# Patient Record
Sex: Female | Born: 1965 | Race: White | Hispanic: No | Marital: Married | Smoking: Never smoker
Health system: Southern US, Community
[De-identification: ages and names within clinical notes are randomized; demographics above are authoritative.]

## PROBLEM LIST (undated history)

## (undated) DIAGNOSIS — IMO0001 Reserved for inherently not codable concepts without codable children: Secondary | ICD-10-CM

## (undated) DIAGNOSIS — E559 Vitamin D deficiency, unspecified: Secondary | ICD-10-CM

## (undated) DIAGNOSIS — F329 Major depressive disorder, single episode, unspecified: Secondary | ICD-10-CM

## (undated) DIAGNOSIS — R232 Flushing: Secondary | ICD-10-CM

## (undated) DIAGNOSIS — D126 Benign neoplasm of colon, unspecified: Secondary | ICD-10-CM

## (undated) DIAGNOSIS — N390 Urinary tract infection, site not specified: Secondary | ICD-10-CM

## (undated) DIAGNOSIS — N200 Calculus of kidney: Secondary | ICD-10-CM

## (undated) DIAGNOSIS — F419 Anxiety disorder, unspecified: Secondary | ICD-10-CM

## (undated) DIAGNOSIS — Z8619 Personal history of other infectious and parasitic diseases: Secondary | ICD-10-CM

## (undated) DIAGNOSIS — F32A Depression, unspecified: Secondary | ICD-10-CM

## (undated) DIAGNOSIS — E213 Hyperparathyroidism, unspecified: Secondary | ICD-10-CM

## (undated) HISTORY — DX: Depression, unspecified: F32.A

## (undated) HISTORY — DX: Anxiety disorder, unspecified: F41.9

## (undated) HISTORY — DX: Urinary tract infection, site not specified: N39.0

## (undated) HISTORY — DX: Flushing: R23.2

## (undated) HISTORY — DX: Benign neoplasm of colon, unspecified: D12.6

## (undated) HISTORY — DX: Personal history of other infectious and parasitic diseases: Z86.19

## (undated) HISTORY — DX: Major depressive disorder, single episode, unspecified: F32.9

## (undated) HISTORY — DX: Vitamin D deficiency, unspecified: E55.9

## (undated) HISTORY — DX: Hyperparathyroidism, unspecified: E21.3

---
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
## 1990-04-21 HISTORY — PX: TONSILLECTOMY: SUR1361

## 2011-04-22 DIAGNOSIS — D126 Benign neoplasm of colon, unspecified: Secondary | ICD-10-CM

## 2011-04-22 HISTORY — DX: Benign neoplasm of colon, unspecified: D12.6

## 2011-11-26 HISTORY — PX: POLYPECTOMY: SHX149

## 2014-03-12 ENCOUNTER — Emergency Department (HOSPITAL_COMMUNITY)
Admission: EM | Admit: 2014-03-12 | Discharge: 2014-03-12 | Disposition: A | Discharge status: Home / Self Care | Payer: Managed Care, Other (non HMO) | Attending: Emergency Medicine | Admitting: Emergency Medicine

## 2014-03-12 ENCOUNTER — Emergency Department (HOSPITAL_COMMUNITY): Payer: Managed Care, Other (non HMO)

## 2014-03-12 ENCOUNTER — Encounter (HOSPITAL_COMMUNITY): Payer: Self-pay | Admitting: Emergency Medicine

## 2014-03-12 DIAGNOSIS — Z79899 Other long term (current) drug therapy: Secondary | ICD-10-CM | POA: Diagnosis not present

## 2014-03-12 DIAGNOSIS — N200 Calculus of kidney: Secondary | ICD-10-CM

## 2014-03-12 DIAGNOSIS — N201 Calculus of ureter: Secondary | ICD-10-CM

## 2014-03-12 DIAGNOSIS — Z3202 Encounter for pregnancy test, result negative: Secondary | ICD-10-CM | POA: Diagnosis not present

## 2014-03-12 DIAGNOSIS — R109 Unspecified abdominal pain: Secondary | ICD-10-CM | POA: Diagnosis present

## 2014-03-12 DIAGNOSIS — N202 Calculus of kidney with calculus of ureter: Secondary | ICD-10-CM | POA: Insufficient documentation

## 2014-03-12 HISTORY — DX: Calculus of kidney: N20.0

## 2014-03-12 LAB — URINE MICROSCOPIC-ADD ON

## 2014-03-12 LAB — URINALYSIS, ROUTINE W REFLEX MICROSCOPIC
BILIRUBIN URINE: NEGATIVE
Glucose, UA: NEGATIVE mg/dL
KETONES UR: NEGATIVE mg/dL
Leukocytes, UA: NEGATIVE
NITRITE: NEGATIVE
PH: 6 (ref 5.0–8.0)
Protein, ur: NEGATIVE mg/dL
Specific Gravity, Urine: 1.012 (ref 1.005–1.030)
UROBILINOGEN UA: 0.2 mg/dL (ref 0.0–1.0)

## 2014-03-12 LAB — I-STAT CHEM 8, ED
BUN: 20 mg/dL (ref 6–23)
CREATININE: 0.9 mg/dL (ref 0.50–1.10)
Calcium, Ion: 1.28 mmol/L — ABNORMAL HIGH (ref 1.12–1.23)
Chloride: 105 mEq/L (ref 96–112)
GLUCOSE: 120 mg/dL — AB (ref 70–99)
HCT: 44 % (ref 36.0–46.0)
HEMOGLOBIN: 15 g/dL (ref 12.0–15.0)
POTASSIUM: 4.2 meq/L (ref 3.7–5.3)
Sodium: 138 mEq/L (ref 137–147)
TCO2: 23 mmol/L (ref 0–100)

## 2014-03-12 LAB — PREGNANCY, URINE: Preg Test, Ur: NEGATIVE

## 2014-03-12 MED ORDER — TAMSULOSIN HCL 0.4 MG PO CAPS: 0.4000 mg | ORAL_CAPSULE | Freq: Every day | ORAL | Status: DC

## 2014-03-12 MED ORDER — KETOROLAC TROMETHAMINE 60 MG/2ML IM SOLN
30.0000 mg | Freq: Once | INTRAMUSCULAR | Status: AC
  Administered 2014-03-12: 30 mg via INTRAMUSCULAR
  Filled 2014-03-12: qty 2

## 2014-03-12 MED ORDER — HYDROCODONE-ACETAMINOPHEN 5-325 MG PO TABS
1.0000 | ORAL_TABLET | Freq: Once | ORAL | Status: AC
  Administered 2014-03-12: 1 via ORAL
  Filled 2014-03-12: qty 1

## 2014-03-12 MED ORDER — ONDANSETRON 4 MG PO TBDP
4.0000 mg | ORAL_TABLET | Freq: Once | ORAL | Status: DC
  Filled 2014-03-12: qty 1

## 2014-03-12 MED ORDER — HYDROCODONE-ACETAMINOPHEN 5-325 MG PO TABS: 1.0000 | ORAL_TABLET | ORAL | Status: DC | PRN

## 2014-03-12 MED ORDER — NAPROXEN 500 MG PO TABS: 500.0000 mg | ORAL_TABLET | Freq: Two times a day (BID) | ORAL | Status: DC

## 2014-03-12 NOTE — ED Notes (Signed)
Pt c/o left flank pain and hurts to touch and n/v, pt does have hx of kidney stones and states this feels the same.

## 2014-03-12 NOTE — ED Provider Notes (Signed)
CSN: [id number]     Arrival date & time 03/12/14  7829 History   None    Chief Complaint  Patient presents with  . Back Pain  . Flank Pain    left   Patricia Ware is a 48 y.o. female with a history of 3 previous kidney stones who presents to the ED complaining of left flank pain intermittently since yesterday. She reports pain feels similar to her previous kidney stones. She reports the pain has been waxing and waning but is currently 7 out of 10 and dull.  She reports associated nausea and has vomited twice today with increased pain. She denies abdominal pain , fevers, dysuria, hematuria, diarrhea, chest pain, shortness of breath, or decreased urine output. She reports she has never had stents or lithotripsy for previous stones. She reports she has recently moved to the area from [place] and was seeing a urologist there. She reports she has an appointment with primary [facility] on 03/24/2014. She reports she is perimenopausal and her last cycle was February 2015.  (Consider location/radiation/quality/duration/timing/severity/associated sxs/prior Treatment) HPI  Past Medical History  Diagnosis Date  . Kidney stone    Past Surgical History  Procedure Laterality Date  . Cesarean section     No family history on file. History  Substance Use Topics  . Smoking status: Never Smoker   . Smokeless tobacco: Not on file  . Alcohol Use: Yes   OB History    No data available     Review of Systems  Constitutional: Negative for fever, chills and fatigue.  HENT: Negative for congestion, ear pain, sneezing, sore throat and trouble swallowing.   Eyes: Negative for pain and visual disturbance.  Respiratory: Negative for cough, shortness of breath and wheezing.   Cardiovascular: Negative for chest pain, palpitations and leg swelling.  Gastrointestinal: Positive for nausea and vomiting. Negative for abdominal pain, diarrhea and blood in stool.  Genitourinary: Positive for flank pain.  Negative for dysuria, urgency, hematuria, decreased urine volume, vaginal bleeding, difficulty urinating and menstrual problem.  Musculoskeletal: Negative for myalgias, back pain, neck pain and neck stiffness.  Skin: Negative for pallor, rash and wound.  Neurological: Negative for weakness, light-headedness and headaches.  All other systems reviewed and are negative.     Allergies  Review of patient's allergies indicates no known allergies.  Home Medications   Prior to Admission medications   Medication Sig Start Date End Date Taking? Authorizing Provider  escitalopram (LEXAPRO) 10 MG tablet Take 10 mg by mouth daily. 02/13/14  Yes Historical Provider, MD  saccharomyces boulardii (FLORASTOR) 250 MG capsule Take 250 mg by mouth daily.   Yes Historical Provider, MD  HYDROcodone-acetaminophen (NORCO/VICODIN) 5-325 MG per tablet Take 1 tablet by mouth every 4 (four) hours as needed for moderate pain or severe pain. 03/12/14   Verda Cumins Aarti Mankowski, PA-C  naproxen (NAPROSYN) 500 MG tablet Take 1 tablet (500 mg total) by mouth 2 (two) times daily with a meal. 03/12/14   Verda Cumins Jacinto Keil, PA-C  tamsulosin (FLOMAX) 0.4 MG CAPS capsule Take 1 capsule (0.4 mg total) by mouth daily. 03/12/14   Verda Cumins Ceana Fiala, PA-C   BP 106/71 mmHg  Pulse 74  Temp(Src) 98.4 F (36.9 C) (Oral)  Resp 16  SpO2 96% Physical Exam  Constitutional: She appears well-developed and well-nourished. No distress.  HENT:  Head: Normocephalic and atraumatic.  Mouth/Throat: Oropharynx is clear and moist. No oropharyngeal exudate.  Eyes: Conjunctivae are normal. Pupils are equal, round, and reactive  to light. Right eye exhibits no discharge. Left eye exhibits no discharge.  Neck: Neck supple.  Cardiovascular: Normal rate, regular rhythm, normal heart sounds and intact distal pulses.  Exam reveals no gallop and no friction rub.   No murmur heard. Pulmonary/Chest: Effort normal and breath sounds normal. No  respiratory distress. She has no wheezes. She has no rales.  Abdominal: Soft. Bowel sounds are normal. She exhibits no distension and no mass. There is no tenderness. There is no rebound and no guarding.  No CVA tenderness.  No rebound tenderness. Negative Rovsing sign. Negative psoas and obturator sign.  Musculoskeletal: She exhibits no edema.  Lymphadenopathy:    She has no cervical adenopathy.  Neurological: She is alert. Coordination normal.  Skin: Skin is warm and dry. No rash noted. She is not diaphoretic. No erythema. No pallor.  Psychiatric: She has a normal mood and affect. Her behavior is normal.  Nursing note and vitals reviewed.   ED Course  Procedures (including critical care time) Labs Review Labs Reviewed  URINALYSIS, ROUTINE W REFLEX MICROSCOPIC - Abnormal; Notable for the following:    Hgb urine dipstick SMALL (*)    All other components within normal limits  I-STAT CHEM 8, ED - Abnormal; Notable for the following:    Glucose, Bld 120 (*)    Calcium, Ion 1.28 (*)    All other components within normal limits  PREGNANCY, URINE  URINE MICROSCOPIC-ADD ON    Imaging Review Ct Renal Stone Study  03/12/2014   CLINICAL DATA:  Left flank pain.  Nausea, vomiting.  EXAM: CT ABDOMEN AND PELVIS WITHOUT CONTRAST  TECHNIQUE: Multidetector CT imaging of the abdomen and pelvis was performed following the standard protocol without IV contrast.  COMPARISON:  None.  FINDINGS: Lung bases are clear.  No effusions.  Heart is normal size.  Low-density area within the left hepatic lobe measures 18 mm on image 15, most likely a cyst although this cannot be characterized without intravenous contrast. Gallbladder, spleen, pancreas, adrenals and right kidney have an unremarkable unenhanced appearance.  There is mild to moderate left hydronephrosis due to 5 mm proximal left ureteral stone. Mild perinephric stranding. Multiple small non obstructing stones within the left kidney. Urinary bladder is  unremarkable.  Small cyst within the right ovary measuring 2.6 cm. The uterus and left adnexa unremarkable. Appendix is visualized and is normal. Stomach, large and small bowel grossly unremarkable. No acute bony abnormality.  IMPRESSION: 5 mm proximal left ureteral stone with mild to moderate left hydronephrosis and mild perinephric stranding. Left nephrolithiasis.   Electronically Signed   By: Rolm Baptise M.D.   On: 03/12/2014 10:29     EKG Interpretation None      Filed Vitals:   03/12/14 0920 03/12/14 1129  BP: 136/84 106/71  Pulse: 93 74  Temp: 98.2 F (36.8 C) 98.4 F (36.9 C)  TempSrc: Oral Oral  Resp: 18 16  SpO2: 99% 96%     MDM   Meds given in ED:  Medications  ondansetron (ZOFRAN-ODT) disintegrating tablet 4 mg (4 mg Oral Not Given 03/12/14 1001)  ketorolac (TORADOL) injection 30 mg (30 mg Intramuscular Given 03/12/14 1001)  HYDROcodone-acetaminophen (NORCO/VICODIN) 5-325 MG per tablet 1 tablet (1 tablet Oral Given 03/12/14 1121)    Discharge Medication List as of 03/12/2014 11:53 AM    START taking these medications   Details  HYDROcodone-acetaminophen (NORCO/VICODIN) 5-325 MG per tablet Take 1 tablet by mouth every 4 (four) hours as needed for moderate pain  or severe pain., Starting 03/12/2014, Until Discontinued, Print    naproxen (NAPROSYN) 500 MG tablet Take 1 tablet (500 mg total) by mouth 2 (two) times daily with a meal., Starting 03/12/2014, Until Discontinued, Print    tamsulosin (FLOMAX) 0.4 MG CAPS capsule Take 1 capsule (0.4 mg total) by mouth daily., Starting 03/12/2014, Until Discontinued, Print        Final diagnoses:  Left flank pain  Nephrolithiasis  Ureterolithiasis   Patricia Ware is a 48 y.o. female with a history of 3 previous kidney stones who presents to the ED complaining of left flank pain intermittently since yesterday. She reports her pain feels like her previous kidney stones. CT abdomen revealed a 5 mm proximal left ureteral  stone with mild to moderate left hydronephrosis. Cells left nephrolithiasis. Patient's urinalysis indicated small myohemoglobin, it is otherwise unremarkable. Patient reports her pain is much improved with Toradol and Norco as well as Zofran. Will discharge with Naprosyn, Flomax and Norco for pain control. Patient given referral for on-call urologist to follow-up with. Patient works she has an appointment with her PCP for 03/24/2014. Advised follow-up with her primary care this week. Patient was provided resource list for urologists and PCP. Patient education on kidney stones. Advised patient return to the ED with new or worsening symptoms or new concerns. Patient verbalized understanding and agreement pain.  Patient discussed with and evaluated by Dr. Kathrynn Humble who agrees assessment and plan.     Hanley Hays, PA-C 03/12/14 1237  Varney Biles, MD 03/12/14 [id number]

## 2014-03-12 NOTE — Discharge Instructions (Signed)
Kidney Stones °Kidney stones (urolithiasis) are deposits that form inside your kidneys. The intense pain is caused by the stone moving through the urinary tract. When the stone moves, the ureter goes into spasm around the stone. The stone is usually passed in the urine.  °CAUSES  °· A disorder that makes certain neck glands produce too much parathyroid hormone (primary hyperparathyroidism). °· A buildup of uric acid crystals, similar to gout in your joints. °· Narrowing (stricture) of the ureter. °· A kidney obstruction present at birth (congenital obstruction). °· Previous surgery on the kidney or ureters. °· Numerous kidney infections. °SYMPTOMS  °· Feeling sick to your stomach (nauseous). °· Throwing up (vomiting). °· Blood in the urine (hematuria). °· Pain that usually spreads (radiates) to the groin. °· Frequency or urgency of urination. °DIAGNOSIS  °· Taking a history and physical exam. °· Blood or urine tests. °· CT scan. °· Occasionally, an examination of the inside of the urinary bladder (cystoscopy) is performed. °TREATMENT  °· Observation. °· Increasing your fluid intake. °· Extracorporeal shock wave lithotripsy--This is a noninvasive procedure that uses shock waves to break up kidney stones. °· Surgery may be needed if you have severe pain or persistent obstruction. There are various surgical procedures. Most of the procedures are performed with the use of small instruments. Only small incisions are needed to accommodate these instruments, so recovery time is minimized. °The size, location, and chemical composition are all important variables that will determine the proper choice of action for you. Talk to your health care provider to better understand your situation so that you will minimize the risk of injury to yourself and your kidney.  °HOME CARE INSTRUCTIONS  °· Drink enough water and fluids to keep your urine clear or pale yellow. This will help you to pass the stone or stone fragments. °· Strain  all urine through the provided strainer. Keep all particulate matter and stones for your health care provider to see. The stone causing the pain may be as small as a grain of salt. It is very important to use the strainer each and every time you pass your urine. The collection of your stone will allow your health care provider to analyze it and verify that a stone has actually passed. The stone analysis will often identify what you can do to reduce the incidence of recurrences. °· Only take over-the-counter or prescription medicines for pain, discomfort, or fever as directed by your health care provider. °· Make a follow-up appointment with your health care provider as directed. °· Get follow-up X-rays if required. The absence of pain does not always mean that the stone has passed. It may have only stopped moving. If the urine remains completely obstructed, it can cause loss of kidney function or even complete destruction of the kidney. It is your responsibility to make sure X-rays and follow-ups are completed. Ultrasounds of the kidney can show blockages and the status of the kidney. Ultrasounds are not associated with any radiation and can be performed easily in a matter of minutes. °SEEK MEDICAL CARE IF: °· You experience pain that is progressive and unresponsive to any pain medicine you have been prescribed. °SEEK IMMEDIATE MEDICAL CARE IF:  °· Pain cannot be controlled with the prescribed medicine. °· You have a fever or shaking chills. °· The severity or intensity of pain increases over 18 hours and is not relieved by pain medicine. °· You develop a new onset of abdominal pain. °· You feel faint or pass out. °·   You are unable to urinate. MAKE SURE YOU:   Understand these instructions.  Will watch your condition.  Will get help right away if you are not doing well or get worse. Document Released: 04/07/2005 Document Revised: 12/08/2012 Document Reviewed: 09/08/2012 [facility] Patient Information 2015  [place], [place]. This information is not intended to replace advice given to you by your health care provider. Make sure you discuss any questions you have with your health care provider.  Emergency Department Resource Guide 1) Find a Doctor and Pay Out of Pocket Although you won't have to find out who is covered by your insurance plan, it is a good idea to ask around and get recommendations. You will then need to call the office and see if the doctor you have chosen will accept you as a new patient and what types of options they offer for patients who are self-pay. Some doctors offer discounts or will set up payment plans for their patients who do not have insurance, but you will need to ask so you aren't surprised when you get to your appointment.  2) Contact Your Local Health Department Not all health departments have doctors that can see patients for sick visits, but many do, so it is worth a call to see if yours does. If you don't know where your local health department is, you can check in your phone book. The CDC also has a tool to help you locate your state's health department, and many state websites also have listings of all of their local health departments.  3) Find a [facility] If your illness is not likely to be very severe or complicated, you may want to try a walk in clinic. These are popping up all over the country in pharmacies, drugstores, and shopping centers. They're usually staffed by nurse practitioners or physician assistants that have been trained to treat common illnesses and complaints. They're usually fairly quick and inexpensive. However, if you have serious medical issues or chronic medical problems, these are probably not your best option.  No Primary Care Doctor: - Call Health Connect at  [phone] - they can help you locate a primary care doctor that  accepts your insurance, provides certain services, etc. - Physician Referral Service- [phone]  Chronic Pain  Problems: Organization         Address  Phone   Notes  [facility]  [phone] Patients need to be referred by their primary care doctor.   Medication Assistance: Organization         Address  Phone   Notes  [facility] Medication [facility] [address] [place], [place] [address] [phone] --Must be a resident of [facility] -- Must have NO insurance coverage whatsoever (no Medicaid/ Medicare, etc.) -- The pt. MUST have a primary care doctor that directs their care regularly and follows them in the community   MedAssist  [phone]   [employer]  [phone]    Agencies that provide inexpensive medical care: Organization         Address  Phone   Notes  [place]  [phone]   [facility]    [phone]   [facility] [place], [place] [address] [phone]   [place] [address], [place] [phone]   Planned Parenthood    [phone]   [facility]    [phone])  [place]  [place] [address], [place] Phone:  [phone], Fax:  [phone] Hours of Operation:  9 am - 6 pm, M-F.  Also accepts Medicaid/Medicare and self-pay.  [facility] for [place] [address], [address], [place] Phone: [phone], Fax: [phone]. Hours of Operation:  8:30 am - 5:30 pm, M-F.  Also accepts Medicaid and self-pay.  [facility] [place] [address] [place] Phone: [phone]   [place], [place], [place] [phone], Ext. [phone] Mondays & Thursdays: 7-9 AM.  First 15 patients are seen on a first come, first serve basis.    [place] Providers:  Organization         Address  Phone   Notes  [facility] [address],  [phone] Also  accepts self-pay patients.  [facility] [address], [place]  [phone]   [place], [address], [place] [phone]   [facility] [address] [place] [phone]   Lucianne Lei [address], [place]   [phone] Only accepts [place] Access [place] patients after they have their name applied to their card.   Self-Pay (no insurance) in [facility]:  Organization         Address  Phone   Notes  Sickle Cell Patients, [facility] [place] [phone]   [facility] [place] [phone]   [facility] [place]  [place], [place], [place] [phone]   [facility]/Dr. Osei-Bonsu  [address], [place] or [address], [place] [phone] Phone number for both [place] and [place] locations is the same.  Urgent Medical and [facility] [address] [place] [phone]   [facility] [address] [place] or [address] [phone]   [facility] [address] [place] [phone], phone; [phone], fax Sees patients 1st and 3rd Saturday of every month.  Must not qualify for public or private insurance (i.e. Medicaid, Medicare, [place] Health Choice, Veterans' Benefits)  Household income should be no more than 200% of the poverty level The clinic cannot treat you if you are pregnant or think you are pregnant  Sexually transmitted diseases are not treated at the clinic.    Dental Care: Organization         Address  Phone  Notes  [facility] Department of [facility] [place] [phone] Accepts children up to age 37 who are enrolled in [place] or [place]; pregnant  women with a Medicaid card; and children who have applied for Medicaid or [place] Health Choice, but were declined, whose parents can pay a reduced fee at time of service.  [facility] Department of [facility]  [address], [place] [phone] Accepts children up to age 23 who are enrolled in [place] or [place]; pregnant women with a Medicaid card; and children who have applied for Medicaid or [place] Health Choice, but were declined, whose parents can pay a reduced fee at time of service.  [place] [facility] Access  PROGRAM  [place] [phone] Patients are seen by appointment only. Walk-ins are not accepted. [place] will see patients 44 years of age and older. Monday - Tuesday (8am-5pm) Most Wednesdays (8:30-5pm) $30 per visit, cash only  [facility] Adult Dental Access PROGRAM  [address], [facility] [phone] Patients are seen by appointment only. Walk-ins are not accepted. [place] will see patients 70 years of age and older. One Wednesday Evening (Monthly: Volunteer Based).  $30 per visit, cash only  [place]  [phone] for adults; Children under age 22, call Graduate Pediatric Dentistry at [phone]. Children aged 3-14, please call [phone] to request a pediatric application.  Dental services are provided in all areas of dental care including fillings, crowns and bridges, complete and partial dentures, implants, gum treatment, root canals, and extractions. Preventive care is also provided. Treatment is provided to both adults and children. Patients are selected via a lottery and there is often a waiting list.   [facility] [address] [place]  [phone] [email or web address]   [facility] [address] [place], [place] [phone], Ext. [phone] Second and Fourth Thursday of each month, opens at 6:30 AM; Clinic ends at 9 AM.  Patients are  seen on a first-come first-served basis, and a limited number are seen during each clinic.   [facility]  [address] Hillard Danker [place], [place] [phone]   Eligibility Requirements You must have lived in [place], [place], or [place] counties for at least the last three months.   You cannot be eligible for state or federal sponsored [employer], including [employer], [place], or [employer].   You generally cannot be eligible for healthcare insurance through your employer.    How to apply: Eligibility screenings are held every Tuesday and Wednesday afternoon from 1:00 pm until 4:00 pm. You do not need an appointment for the interview!  [facility] [address] [place], [place]   [place]  [place] Department  [place]  [phone]    Behavioral Health Resources in the Community: Intensive Outpatient Programs Organization         Address  Phone  Notes  [place]. [address], [place], [place] [phone]   [facility]patient [address] [place], [place]   ADS: Alcohol & Drug Svcs [address], [place], [place]   [place] [address]  [place], [place] or [phone]   Substance Abuse Resources Organization         Address  Phone  Notes  Alcohol and Drug Services  [phone]   [place]  [phone]   The [place]   Chinita Pester  [phone]   Residential & Outpatient Substance Abuse Program  [phone]   Psychological Services Organization         Address  Phone  Notes  [facility] [place]  [place]  [phone]   [place] [address], [place] or [phone]    Mobile Crisis  Teams Organization         Address  Phone  Notes  [facility], Mobile Crisis Care Unit  [phone]   [facility]  [address] [place], [place]  [place], [place] [phone]    Self-Help/Support Groups Organization         Address  Phone             Notes  [facility] of [place] - variety of support groups  [place] Call for more information  Narcotics Anonymous (NA), [facility] [address], [employer] [place]  2 meetings at this location   Special educational needs teacher         Address  Phone  Notes  [facility]idential [facility] [place],    [place]  [phone]   [facility]  [address], [place] [address], [place], [place]   [place], [place] [phone] Admissions: 8am-3pm M-F  Incentives Substance [place] [address],    [place], [place] [phone]   [facility] [address] [place], [place], [place]   [facility] [address]  [place], [place]   Insight Programs - Intensive Outpatient [address] Kristeen [address], [place], [place]   [facility] [place].,  [place], [place] [phone] or [phone]   [facility] Services (RTS) [address], [place], [place] Accepts Medicaid  [facility] [place] [address],  [place] [phone] Substance Abuse/Addiction Treatment   [facility] Organization         Address  Phone  Notes  [facility]  [phone]   Domenic Schwab, PhD [address] Arlis Port[place], [place]   [phone] or [phone]   [place], [place] [phone]   [facility] [address], [place], [place] [phone]  Insurance/Medicaid/sponsorship through [facility] and Families [address]                                    [place], [place] [phone] [place] [address], [place] [phone]    Dr. Adele Schilder  [phone]   [facility] [place] [facility] Dept. 1) [address] [employer] 2) [place] 3)  [place] [address], [place] [phone]  [phone]   [place] [phone] or [phone] (After Hours)

## 2014-03-24 ENCOUNTER — Encounter: Payer: Self-pay | Admitting: Family Medicine

## 2014-03-24 ENCOUNTER — Ambulatory Visit (INDEPENDENT_AMBULATORY_CARE_PROVIDER_SITE_OTHER): Payer: Managed Care, Other (non HMO) | Admitting: Family Medicine

## 2014-03-24 VITALS — BP 100/78 | HR 74 | Temp 98.2°F | Ht 67.75 in | Wt 173.8 lb

## 2014-03-24 DIAGNOSIS — H9319 Tinnitus, unspecified ear: Secondary | ICD-10-CM

## 2014-03-24 DIAGNOSIS — I781 Nevus, non-neoplastic: Secondary | ICD-10-CM

## 2014-03-24 DIAGNOSIS — F329 Major depressive disorder, single episode, unspecified: Secondary | ICD-10-CM

## 2014-03-24 DIAGNOSIS — Z7189 Other specified counseling: Secondary | ICD-10-CM

## 2014-03-24 DIAGNOSIS — Z7689 Persons encountering health services in other specified circumstances: Secondary | ICD-10-CM

## 2014-03-24 DIAGNOSIS — N200 Calculus of kidney: Secondary | ICD-10-CM

## 2014-03-24 DIAGNOSIS — F32A Depression, unspecified: Secondary | ICD-10-CM

## 2014-03-24 NOTE — [place] Notes (Addendum)
HPI:  Patricia Ware is here to establish care. Recently moved here from  Last PCP and physical: Sees Mehgan Morris for gyn visits  Has the following chronic problems that require follow up and concerns today:  Depression: -on lexapro for about 1 year -for irritability, emotional lability, anxiety, hot flashes -hx of depression before that on zoloft around age 48 -doing well -denies: SI, thoughts of self harm, hx hospitalization -may consider tapering off of this in the spring  Lots of moles: -followed by derm and wants to establish with dermatologist here, denies hx of skin cancer  Ringing in ears: - not pulsitile - has not had in a long time  Mild Hypercalcemia Chronically: -reports PCP simply followed in the past  Hx of Kidney stones: -long hx of kidney stones -went to ER 1.5 weeks ago -has appointment with urologist for this -using naproxen, flomax, hydrocodone - not using much, drinking plenty of water -is feeling better - but has not passed the stone to her knowledge -denies: fevers, chills, nausea, vomiting  ROS negative for unless reported above: fevers, unintentional weight loss, hearing or vision loss, chest pain, palpitations, struggling to breath, hemoptysis, melena, hematochezia, hematuria, falls, loc, si, thoughts of self harm  Past Medical History  Diagnosis Date  . Kidney stone   . Colon polyps   . History of UTI   . Anxiety   . Hot flashes   . History of chicken pox   . Depression     Past Surgical History  Procedure Laterality Date  . Cesarean section    . Umbilical hernia repair  1977  . Tonsillectomy  1992    Family History  Problem Relation Age of Onset  . Ulcerative colitis Father   . Deep vein thrombosis Father   . Pulmonary embolism Father   . Hypertension Father   . Hyperlipidemia Mother     and father  . Osteoarthritis Mother   . Gastric cancer Maternal Grandfather     History   Social History  . Marital Status: Married     Spouse Name: N/A    Number of Children: N/A  . Years of Education: N/A   Social History Main Topics  . Smoking status: Never Smoker   . Smokeless tobacco: None  . Alcohol Use: Yes     Comment: 1-2 drinks a few nights per week  . Drug Use: No  . Sexual Activity: None   Other Topics Concern  . None   Social History Narrative   Work or School: homemaker      Home Situation: lives with husband and 21 yo son in 2015      Spiritual Beliefs: Christian      Lifestyle: no regular exercise; diet is fair          Current outpatient prescriptions: Cholecalciferol (VITAMIN D3) 2000 UNITS TABS, Take by mouth daily., Disp: , Rfl: ;  escitalopram (LEXAPRO) 10 MG tablet, Take 10 mg by mouth daily., Disp: , Rfl: 10;  HYDROcodone-acetaminophen (NORCO/VICODIN) 5-325 MG per tablet, Take 1 tablet by mouth every 4 (four) hours as needed for moderate pain or severe pain., Disp: 10 tablet, Rfl: 0;  Lactobacillus (ACIDOPHILUS PO), Take by mouth daily., Disp: , Rfl:  naproxen (NAPROSYN) 500 MG tablet, Take 1 tablet (500 mg total) by mouth 2 (two) times daily with a meal., Disp: 30 tablet, Rfl: 0;  tamsulosin (FLOMAX) 0.4 MG CAPS capsule, Take 1 capsule (0.4 mg total) by mouth daily., Disp: 10 capsule, Rfl:  0  EXAM:  Filed Vitals:   03/24/14 1125  BP: 100/78  Pulse: 74  Temp: 98.2 F (36.8 C)    Body mass index is 26.62 kg/(m^2).  GENERAL: vitals reviewed and listed above, alert, oriented, appears well hydrated and in no acute distress  HEENT: atraumatic, conjunttiva clear, no obvious abnormalities on inspection of external nose and ears, normal inspection of both ear canals and TMs  NECK: no obvious masses on inspection  LUNGS: clear to auscultation bilaterally, no wheezes, rales or rhonchi, good air movement  CV: HRRR, no peripheral edema  SKIN: fair skin, multiple freckles and moles  MS: moves all extremities without noticeable abnormality  PSYCH: pleasant and cooperative, no  obvious depression or anxiety  ASSESSMENT AND PLAN:  Discussed the following assessment and plan:  Depression -continue lexapro, she wants to try taper of in a January - will follow up then for preventive visit and taper  Nephrolithiasis -seeing urologist  Tinnitus, unspecified laterality -resolved, follow up if recurs  Nevus, non-neoplastic -numbers provided for derm  Hypercalcemia -mild, reports chronic, - advised we recheck this and do further eval ipth, consider endo referral if abnormal -she opted to check in Jan at preventive visit with labs  Encounter to establish care -We reviewed the PMH, PSH, FH, SH, Meds and Allergies. -We provided refills for any medications we will prescribe as needed. -We addressed current concerns per orders and patient instructions. -We have asked for records for pertinent exams, studies, vaccines and notes from previous providers. -We have advised patient to follow up per instructions below.   -Patient advised to return or notify a doctor immediately if symptoms worsen or persist or new concerns arise.  Patient Instructions  Schedule preventive visit in January      KIM, [place]

## 2014-03-24 NOTE — [place] Notes (Signed)
Pre visit review using our clinic review tool, if applicable. No additional management support is needed unless otherwise documented below in the visit note. 

## 2014-03-24 NOTE — Patient Instructions (Signed)
Schedule preventive visit in January

## 2014-06-01 ENCOUNTER — Encounter: Payer: Self-pay | Admitting: Family Medicine

## 2014-06-01 ENCOUNTER — Ambulatory Visit (INDEPENDENT_AMBULATORY_CARE_PROVIDER_SITE_OTHER): Payer: BLUE CROSS/BLUE SHIELD | Admitting: Family Medicine

## 2014-06-01 VITALS — BP 104/80 | HR 70 | Temp 97.8°F | Ht 67.75 in | Wt 173.0 lb

## 2014-06-01 DIAGNOSIS — F32A Depression, unspecified: Secondary | ICD-10-CM

## 2014-06-01 DIAGNOSIS — R6889 Other general symptoms and signs: Secondary | ICD-10-CM

## 2014-06-01 DIAGNOSIS — Z0000 Encounter for general adult medical examination without abnormal findings: Secondary | ICD-10-CM

## 2014-06-01 DIAGNOSIS — F329 Major depressive disorder, single episode, unspecified: Secondary | ICD-10-CM

## 2014-06-01 DIAGNOSIS — Z86010 Personal history of colonic polyps: Secondary | ICD-10-CM

## 2014-06-01 LAB — LIPID PANEL
CHOL/HDL RATIO: 3
Cholesterol: 168 mg/dL (ref 0–200)
HDL: 62.6 mg/dL (ref >39.00)
LDL CALC: 89 mg/dL (ref 0–99)
NonHDL: 105.4
TRIGLYCERIDES: 81 mg/dL (ref 0.0–149.0)
VLDL: 16.2 mg/dL (ref 0.0–40.0)

## 2014-06-01 LAB — HEMOGLOBIN A1C: Hgb A1c MFr Bld: 5.6 % (ref 4.6–6.5)

## 2014-06-01 NOTE — [place] Notes (Signed)
Pre visit review using our clinic review tool, if applicable. No additional management support is needed unless otherwise documented below in the visit note. 

## 2014-06-01 NOTE — Patient Instructions (Signed)
BEFORE YOU LEAVE: -labs -follow up  We placed a referral for you as discussed for the colonoscopy. It usually takes about 1-2 weeks to process and schedule this referral. If you have not heard from [place] regarding this appointment in 2 weeks please contact our office.  -We have ordered labs or studies at this visit. It can take up to 1-2 weeks for results and processing. We will contact you with instructions IF your results are abnormal. Normal results will be released to your [facility]. If you have not heard from [place] or can not find your results in [facility] in 2 weeks please contact our office.  -PLEASE SIGN UP FOR MYCHART TODAY   We recommend the following healthy lifestyle measures: - eat a healthy diet consisting of lots of vegetables, fruits, beans, nuts, seeds, healthy meats such as white chicken and fish and whole grains.  - avoid fried foods, fast food, processed foods, sodas, red meet and other fattening foods.  - get a least 150 minutes of aerobic exercise per week.

## 2014-06-01 NOTE — Progress Notes (Signed)
HPI:  Here for CPE:  -Concerns and/or follow up today:   Depression: -on lexapro for about 1 year - now weaning and down to 5 mg and doing well and want tos stop next week -for irritability, emotional lability, anxiety, hot flashes in the past - no symptoms now -denies: SI, thoughts of self harm, hx hospitalization  Lots of moles: -followed by derm  - saw them a few weeks ago -no hx skin cancer  Mild Hypercalcemia Chronically: -reports PCP simply followed in the past and declined offered eval at initial visit -agrees to recheck today -does not take calcium or multivitamin D -she take vitamin D - every other day per her gyn doc -denies pain, weight loss, fevers, abd pain, or any other rsymptoms  Hx of Kidney stones: -long hx of kidney stones -[facility] -reports doing much better, working on diet, going to have urine analysis -denies: fevers, chills, nausea, vomiting  -Diet: variety of foods, balance and well rounded  -Exercise: no regular exercise - but starting this week every other day walking  -Taking folic acid, vitamin D or calcium: vit d  -Diabetes and Dyslipidemia Screening: doing today - FASTING  -Hx of HTN: no  -Vaccines: UTD  -pap history: had this with gyn  -FDLMP: almost 1 year  -sexual activity: yes, female partner, no new partners  -wants STI testing: no  -FH breast, colon or ovarian ca: see FH Last mammogram: don with gyn Last colon cancer screening: reports had colonoscopy 3 years ago for abd pain and had polyp and reports told needed repeat colonoscopy in 3 years  -Alcohol, Tobacco, drug use: see social history  Review of Systems - no fevers, unintentional weight loss, vision loss, hearing loss, chest pain, sob, hemoptysis, melena, hematochezia, hematuria, genital discharge, changing or concerning skin lesions, bleeding, bruising, loc, thoughts of self harm or SI  Past Medical History  Diagnosis Date  . Kidney stone   .  Colon polyps   . History of UTI   . Anxiety   . Hot flashes   . History of chicken pox   . Depression     Past Surgical History  Procedure Laterality Date  . Cesarean section    . Umbilical hernia repair  1977  . Tonsillectomy  1992    Family History  Problem Relation Age of Onset  . Ulcerative colitis Father   . Deep vein thrombosis Father   . Pulmonary embolism Father   . Hypertension Father   . Hyperlipidemia Mother     and father  . Osteoarthritis Mother   . Gastric cancer Maternal Grandfather     History   Social History  . Marital Status: Married    Spouse Name: N/A  . Number of Children: N/A  . Years of Education: N/A   Social History Main Topics  . Smoking status: Never Smoker   . Smokeless tobacco: Not on file  . Alcohol Use: Yes     Comment: 1-2 drinks a few nights per week  . Drug Use: No  . Sexual Activity: Not on file   Other Topics Concern  . None   Social History Narrative   Work or School: homemaker      Home Situation: lives with husband and 96 yo son in 2015      Spiritual Beliefs: Christian      Lifestyle: no regular exercise; diet is fair           Current outpatient prescriptions:  .  Cholecalciferol (VITAMIN D3) 2000 UNITS TABS, Take by mouth daily., Disp: , Rfl:  .  escitalopram (LEXAPRO) 10 MG tablet, Take 5 mg by mouth daily. , Disp: , Rfl: 10 .  Lactobacillus (ACIDOPHILUS PO), Take by mouth daily., Disp: , Rfl:   EXAM:  Filed Vitals:   06/01/14 0922  BP: 104/80  Pulse: 70  Temp: 97.8 F (36.6 C)    GENERAL: vitals reviewed and listed below, alert, oriented, appears well hydrated and in no acute distress  HEENT: head atraumatic, PERRLA, normal appearance of eyes, ears, nose and mouth. moist mucus membranes.  NECK: supple, no masses or lymphadenopathy  LUNGS: clear to auscultation bilaterally, no rales, rhonchi or wheeze  CV: HRRR, no peripheral edema or cyanosis, normal pedal pulses  BREAST:  declined  ABDOMEN: bowel sounds normal, soft, non tender to palpation, no masses, no rebound or guarding  GU: declined  RECTAL: refused  SKIN: no rash or abnormal lesions  MS: normal gait, moves all extremities normally  NEURO: CN II-XII grossly intact, normal muscle strength and sensation to light touch on extremities  PSYCH: normal affect, pleasant and cooperative  ASSESSMENT AND PLAN:  Discussed the following assessment and plan:  Visit for preventive health examination -Discussed and advised all [place] preventive services health task force level A and B recommendations for age, sex and risks.  -Advised at least 150 minutes of exercise per week and a healthy diet low in saturated fats and sweets and consisting of fresh fruits and vegetables, lean meats such as fish and white chicken and whole grains.  -FASTING labs, studies and vaccines per orders this encounter  Hypercalcemia -recheck ionized ca and ipth, discussed causes and likely referral to endocrine if PHPT, versus further workup  Depression -stop SSRI, follow up if needed  Hx of colonic polyp - Plan: Ambulatory referral to Gastroenterology   Orders Placed This Encounter  Procedures  . Lipid Panel  . Hemoglobin A1c  . PTH, intact and calcium  . Ambulatory referral to Gastroenterology    Referral Priority:  Routine    Referral Type:  Consultation    Referral Reason:  Specialty Services Required    Requested Specialty:  Gastroenterology    Number of Visits Requested:  1    Patient advised to return to clinic immediately if symptoms worsen or persist or new concerns.  Patient Instructions  BEFORE YOU LEAVE: -labs -follow up  We placed a referral for you as discussed for the colonoscopy. It usually takes about 1-2 weeks to process and schedule this referral. If you have not heard from [place] regarding this appointment in 2 weeks please contact our office.  -We have ordered labs or studies at this visit. It can  take up to 1-2 weeks for results and processing. We will contact you with instructions IF your results are abnormal. Normal results will be released to your [facility]. If you have not heard from [place] or can not find your results in [facility] in 2 weeks please contact our office.  -PLEASE SIGN UP FOR MYCHART TODAY   We recommend the following healthy lifestyle measures: - eat a healthy diet consisting of lots of vegetables, fruits, beans, nuts, seeds, healthy meats such as white chicken and fish and whole grains.  - avoid fried foods, fast food, processed foods, sodas, red meet and other fattening foods.  - get a least 150 minutes of aerobic exercise per week.        Return in about 1 year (around 06/02/2015), or  if symptoms worsen or fail to improve.  Colin Benton R.

## 2014-06-02 LAB — PTH, INTACT AND CALCIUM
CALCIUM: 10.5 mg/dL (ref 8.4–10.5)
PTH: 119 pg/mL — AB (ref 14–64)

## 2014-06-02 NOTE — Addendum Note (Signed)
Addended by: Agnes Lawrence on: 06/02/2014 04:48 PM   Modules accepted: Orders

## 2014-06-20 ENCOUNTER — Encounter: Payer: Self-pay | Admitting: Family Medicine

## 2014-07-05 ENCOUNTER — Encounter: Payer: Self-pay | Admitting: Internal Medicine

## 2014-07-18 ENCOUNTER — Encounter: Payer: Self-pay | Admitting: Internal Medicine

## 2014-07-25 ENCOUNTER — Encounter: Payer: Self-pay | Admitting: Family Medicine

## 2014-07-25 ENCOUNTER — Ambulatory Visit: Payer: BLUE CROSS/BLUE SHIELD | Admitting: Internal Medicine

## 2014-07-26 MED ORDER — LORAZEPAM 0.5 MG PO TABS: ORAL_TABLET | ORAL | Status: DC

## 2014-07-26 NOTE — Telephone Encounter (Signed)
Patient informed of the message below and she is aware the Rx was sent to her pharmacy.
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
## 2014-08-24 ENCOUNTER — Encounter: Payer: Self-pay | Admitting: Internal Medicine

## 2014-08-24 ENCOUNTER — Ambulatory Visit (INDEPENDENT_AMBULATORY_CARE_PROVIDER_SITE_OTHER): Payer: BLUE CROSS/BLUE SHIELD | Admitting: Internal Medicine

## 2014-08-24 VITALS — BP 112/68 | HR 92 | Temp 98.0°F | Resp 12 | Ht 68.0 in | Wt 173.0 lb

## 2014-08-24 DIAGNOSIS — E213 Hyperparathyroidism, unspecified: Secondary | ICD-10-CM

## 2014-08-24 DIAGNOSIS — E559 Vitamin D deficiency, unspecified: Secondary | ICD-10-CM

## 2014-08-24 LAB — VITAMIN D 25 HYDROXY (VIT D DEFICIENCY, FRACTURES): VITD: 22.14 ng/mL — AB (ref 30.00–100.00)

## 2014-08-24 MED ORDER — VITAMIN D 50 MCG (2000 UT) PO CAPS: ORAL_CAPSULE | ORAL | Status: DC

## 2014-08-24 NOTE — Patient Instructions (Signed)
Please stop at the lab.  Please start a urine collection ONLY if the vitamin D is normal. If not, we will need to start vitamin D supplements before collecting the urine.  Patient information (Up-to-Date): Collection of a 24-hour urine specimen  - You should collect every drop of urine during each 24-hour period. It does not matter how much or little urine is passed each time, as long as every drop is collected. - Begin the urine collection in the morning after you wake up, after you have emptied your bladder for the first time. - Urinate (empty the bladder) for the first time and flush it down the toilet. Note the exact time (eg, 6:15 AM). You will begin the urine collection at this time. - Collect every drop of urine during the day and night in an empty collection bottle. Store the bottle at room temperature or in the refrigerator. - If you need to have a bowel movement, any urine passed with the bowel movement should be collected. Try not to include feces with the urine collection. If feces does get mixed in, do not try to remove the feces from the urine collection bottle. - Finish by collecting the first urine passed the next morning, adding it to the collection bottle. This should be within ten minutes before or after the time of the first morning void on the first day (which was flushed). In this example, you would try to void between 6:05 and 6:25 on the second day. - If you need to urinate one hour before the final collection time, drink a full glass of water so that you can void again at the appropriate time. If you have to urinate 20 minutes before, try to hold the urine until the proper time. - Please note the exact time of the final collection, even if it is not the same time as when collection began on day 1. - The bottle(s) may be kept at room temperature for a day or two, but should be kept cool or refrigerated for longer periods of time.  Please come back for a follow-up appointment in  6 months

## 2014-08-24 NOTE — Progress Notes (Addendum)
Patient ID: Patricia Ware, female   DOB: [birth year], 49 y.o.   MRN: [id number]  HPI  Patricia Ware is a 49 y.o.-year-old female, referred by her PCP, Dr. Maudie Mercury, for evaluation for hypercalcemia/hyperparathyroidism. She moved to [place] 1 year ago.  Pt was dx with hypercalcemia in 2014. I reviewed pt's pertinent labs: Lab Results  Component Value Date   PTH 119* 06/01/2014   CALCIUM 10.5 06/01/2014  10/26/2012: Ca 10.2 (8.4-10.5) 09/21/2012: Ca 10.4 (8.7-10.2)  No previous DEXA scans. No fractures or falls.   + h/o kidney stones. First in late 20's, repeated episodes in the last 8 years. She has one now. She saw Urology.  No stone was captured. She had further investigation by urology, and we'll try to get these records.  No h/o CKD. Last BUN/Cr: Lab Results  Component Value Date   BUN 20 03/12/2014   CREATININE 0.90 03/12/2014   Pt is not on HCTZ.  No h/o vitamin D deficiency. No vit D levels available for review.   Pt is on vitamin D (not regularly); she also eats dairy and green, leafy, vegetables.   Pt c/o constipation but no AP.  Pt does not have a FH of hypercalcemia, pituitary tumors, thyroid cancer. + osteoporosis in mother. Father with h/o 1 kidney stone.  ROS: Constitutional: + weight gain, no fatigue, + hot flushes, + poor sleep, + excessive urination Eyes: no blurry vision, no xerophthalmia ENT: no sore throat, no nodules palpated in throat, no dysphagia/odynophagia, no hoarseness Cardiovascular: no CP/SOB/+ palpitations/no leg swelling Respiratory: no cough/SOB Gastrointestinal: no N/V/D/C, + heartburn Musculoskeletal: no muscle/joint aches Skin: no rashes, + hair loss Neurological: no tremors/numbness/tingling/dizziness Psychiatric: no depression/+ anxiety + low libido  Past Medical History  Diagnosis Date  . Kidney stone   . Colon polyps   . History of UTI   . Anxiety   . Hot flashes   . History of chicken pox   . Depression    Past Surgical History   Procedure Laterality Date  . Cesarean section    . Umbilical hernia repair  1977  . Tonsillectomy  1992   History   Social History  . Marital Status: Married    Spouse Name: N/A  . children 1   Social History Main Topics  . Smoking status: Never Smoker   . Smokeless tobacco: Not on file  . Alcohol Use: Yes     Comment: 6 wine drinks per week  . Drug Use: No   Social History Narrative   Work or School: homemaker      Home Situation: lives with husband and 33 yo son in 2015      Spiritual Beliefs: Christian      Lifestyle: no regular exercise; diet is fair         Current Outpatient Prescriptions on File Prior to Visit  Medication Sig Dispense Refill  . Cholecalciferol (VITAMIN D3) 2000 UNITS TABS Take by mouth daily.    . Lactobacillus (ACIDOPHILUS PO) Take by mouth daily.    Marland Kitchen LORazepam (ATIVAN) 0.5 MG tablet 0.5-1 tablet 30 min prior to flight (Patient not taking: Reported on 08/24/2014) 5 tablet 0   No current facility-administered medications on file prior to visit.   No Known Allergies Family History  Problem Relation Age of Onset  . Ulcerative colitis Father   . Deep vein thrombosis Father   . Pulmonary embolism Father   . Hypertension Father   . Hyperlipidemia Mother     and father  .  Osteoarthritis Mother   . Gastric cancer Maternal Grandfather     PE: BP 112/68 mmHg  Pulse 92  Temp(Src) 98 F (36.7 C) (Oral)  Resp 12  Ht 5\' 8"  (1.727 m)  Wt 173 lb (78.472 kg)  BMI 26.31 kg/m2  SpO2 98% Wt Readings from Last 3 Encounters:  08/24/14 173 lb (78.472 kg)  06/01/14 173 lb (78.472 kg)  03/24/14 173 lb 12.8 oz (78.835 kg)   Constitutional: overweight, in NAD. No kyphosis. Eyes: PERRLA, EOMI, no exophthalmos ENT: moist mucous membranes, no thyromegaly, no cervical lymphadenopathy Cardiovascular: RRR, No MRG Respiratory: CTA B Gastrointestinal: abdomen soft, NT, ND, BS+ Musculoskeletal: no deformities, strength intact in all 4 Skin: moist, warm,  no rashes Neurological: no tremor with outstretched hands, DTR normal in all 4  Assessment: 1. Hypercalcemia/hyperparathyroidism  Plan: Patient has had several instances of elevated calcium, with the highest level being at 10.4. An intact PTH level was also high, at 119 ([facility] assay).  It is unclear whether she has vitamin D deficiency.  She has apparent complications from hypercalcemia: + h/o nephrolithiasis. No osteoporosis, no fractures. No abdominal pain, depression, bone pain. - I discussed with the patient about the physiology of calcium and parathyroid hormone, and possible side effects from increased PTH, including kidney stones, osteoporosis, abdominal pain, etc.  - We discussed that we need to check whether his hyperparathyroidism is primary (Familial hypercalcemic hypocalciuria or parathyroid adenoma) or secondary (to conditions like: vitamin D deficiency, calcium malabsorption, hypercalciuria, renal insufficiency, etc.). - she meets criteria for parathyroid surgery:  Increased calcium by more than 1 mg/dL above the upper limit of normal  Kidney ds.  Osteoporosis (or Vb fx) Age <50 years old New (2013): High UCa >400 mg/d and increased stone risk by biochemical stone risk analysis Presence of nephrolithiasis or nephrocalcinosis Pt's preference!  - I will need to check: calcium level intact PTH ([facility]) Magnesium Phosphorus Vitamin D 1,25 HO   24h urinary calcium/creatinine ratio - if vit D normal - pt given instructions for urine collection and the jug - However, we will first check her vitamin D >> will proceed with the above investigation only if this is normal - If the tests indicate a parathyroid adenoma, I will refer her to surgery. We discussed possible consequences of hyperparathyroidism: ~1/3 pts will develop complications over 50 years (OP, nephrolithiasis). She agrees with the referral. - I will see the patient back in 6 months, I will discuss through my chart or  by phone  - time spent with the patient: 1 hour, of which >50% was spent in obtaining information about her condition, reviewing her previous labs, evaluations, and treatments, counseling her about her condition (please see the discussed topics above), and developing a plan to further investigate it; she had a number of questions which I addressed.  Office Visit on 08/24/2014  Component Date Value Ref Range Status  . VITD 08/24/2014 22.14* 30.00 - 100.00 ng/mL Final   Vit D low >> start 2000 units of vitamin D daily >> recheck level in 2 months. Hold off with the urine collection until the vitamin D is normal.  Addendum: Component     Latest Ref Rng 10/30/2014  VITD     30.00 - 100.00 ng/mL 27.98 (L)  Vitamin D improved to almost normal range. At this point, we can go ahead and check the above labs. Received records from patient's urologist, Dr. Louis Meckel from 10/10/2014: 24 hour urine calcium was 407 mg a day, which  is elevated. Her urine volume was 1.5 L. Calcium oxalate saturation, was also high, at 12.33, ideally <5. She also has an elevated saturation of calcium phosphate 2.27, ideally <1.2.  Component     Latest Ref Rng 11/01/2014  Sodium     135 - 145 mEq/L 140  Potassium     3.5 - 5.1 mEq/L 3.9  Chloride     96 - 112 mEq/L 106  CO2     19 - 32 mEq/L 29  Glucose     70 - 99 mg/dL 98  BUN     6 - 23 mg/dL 11  Creatinine     0.40 - 1.20 mg/dL 0.69  Calcium     8.4 - 10.5 mg/dL 10.2  GFR     >60.00 mL/min 95.69  Vitamin D 1, 25 (OH) Total     18 - 72 pg/mL 113 (H)  Vitamin D3 1, 25 (OH)      113  Vitamin D2 1, 25 (OH)      <8  PTH     14 - 64 pg/mL 132 (H)  Phosphorus     2.3 - 4.6 mg/dL 2.3  Magnesium     1.5 - 2.5 mg/dL 1.9   The elevated PTH ([facility]), elevated 1, 25 dihydroxy vitamin D, history of kidney stones, elevated urinary calcium, calcium at the upper limit of normal, all points towards primary hyperparathyroidism. I will refer her to  surgery.  CC: Dr. Louis Meckel - [facility]

## 2014-09-20 ENCOUNTER — Ambulatory Visit (AMBULATORY_SURGERY_CENTER): Payer: Self-pay | Admitting: *Deleted

## 2014-09-20 VITALS — Ht 68.0 in | Wt 175.2 lb

## 2014-09-20 DIAGNOSIS — Z86010 Personal history of colonic polyps: Secondary | ICD-10-CM

## 2014-09-20 DIAGNOSIS — Z8371 Family history of colonic polyps: Secondary | ICD-10-CM

## 2014-09-20 MED ORDER — MOVIPREP 100 G PO SOLR: 1.0000 | Freq: Once | ORAL | Status: DC

## 2014-09-20 NOTE — Progress Notes (Signed)
No egg or soy allergy No diet pills No home 02 use No issue with past sedation emmi declined

## 2014-09-27 ENCOUNTER — Encounter: Payer: Self-pay | Admitting: Internal Medicine

## 2014-10-04 ENCOUNTER — Ambulatory Visit (AMBULATORY_SURGERY_CENTER): Payer: BLUE CROSS/BLUE SHIELD | Admitting: Internal Medicine

## 2014-10-04 ENCOUNTER — Encounter: Payer: Self-pay | Admitting: Internal Medicine

## 2014-10-04 VITALS — BP 107/63 | HR 71 | Temp 98.8°F | Resp 23 | Ht 68.0 in | Wt 175.0 lb

## 2014-10-04 DIAGNOSIS — K635 Polyp of colon: Secondary | ICD-10-CM | POA: Diagnosis not present

## 2014-10-04 DIAGNOSIS — Z86010 Personal history of colonic polyps: Secondary | ICD-10-CM | POA: Diagnosis not present

## 2014-10-04 DIAGNOSIS — D125 Benign neoplasm of sigmoid colon: Secondary | ICD-10-CM

## 2014-10-04 HISTORY — PX: COLONOSCOPY: SHX174

## 2014-10-04 MED ORDER — SODIUM CHLORIDE 0.9 % IV SOLN: 500.0000 mL | INTRAVENOUS | Status: DC

## 2014-10-04 NOTE — Progress Notes (Signed)
Patient awakening,vss,report to rn 

## 2014-10-04 NOTE — Progress Notes (Signed)
No problems noted in the recovery room. maw 

## 2014-10-04 NOTE — Patient Instructions (Signed)
YOU HAD AN ENDOSCOPIC PROCEDURE TODAY AT THE  ENDOSCOPY CENTER:   Refer to the procedure report that was given to you for any specific questions about what was found during the examination.  If the procedure report does not answer your questions, please call your gastroenterologist to clarify.  If you requested that your care partner not be given the details of your procedure findings, then the procedure report has been included in a sealed envelope for you to review at your convenience later.  YOU SHOULD EXPECT: Some feelings of bloating in the abdomen. Passage of more gas than usual.  Walking can help get rid of the air that was put into your GI tract during the procedure and reduce the bloating. If you had a lower endoscopy (such as a colonoscopy or flexible sigmoidoscopy) you may notice spotting of blood in your stool or on the toilet paper. If you underwent a bowel prep for your procedure, you may not have a normal bowel movement for a few days.  Please Note:  You might notice some irritation and congestion in your nose or some drainage.  This is from the oxygen used during your procedure.  There is no need for concern and it should clear up in a day or so.  SYMPTOMS TO REPORT IMMEDIATELY:   Following lower endoscopy (colonoscopy or flexible sigmoidoscopy):  Excessive amounts of blood in the stool  Significant tenderness or worsening of abdominal pains  Swelling of the abdomen that is new, acute  Fever of 100F or higher   For urgent or emergent issues, a gastroenterologist can be reached at any hour by calling [phone].   DIET: Your first meal following the procedure should be a small meal and then it is ok to progress to your normal diet. Heavy or fried foods are harder to digest and may make you feel nauseous or bloated.  Likewise, meals heavy in dairy and vegetables can increase bloating.  Drink plenty of fluids but you should avoid alcoholic beverages for 24  hours.  ACTIVITY:  You should plan to take it easy for the rest of today and you should NOT DRIVE or use heavy machinery until tomorrow (because of the sedation medicines used during the test).    FOLLOW UP: Our staff will call the number listed on your records the next business day following your procedure to check on you and address any questions or concerns that you may have regarding the information given to you following your procedure. If we do not reach you, we will leave a message.  However, if you are feeling well and you are not experiencing any problems, there is no need to return our call.  We will assume that you have returned to your regular daily activities without incident.  If any biopsies were taken you will be contacted by phone or by letter within the next 1-3 weeks.  Please call us at [phone] if you have not heard about the biopsies in 3 weeks.    SIGNATURES/CONFIDENTIALITY: You and/or your care partner have signed paperwork which will be entered into your electronic medical record.  These signatures attest to the fact that that the information above on your After Visit Summary has been reviewed and is understood.  Full responsibility of the confidentiality of this discharge information lies with you and/or your care-partner.   Handouts were given to your care partner on polyps, hemorrhoids, and a high fiber diet with liberal fluid intake. You may resume your   current medications today. Await biopsy results. Please call if any questions or concerns.   

## 2014-10-04 NOTE — Op Note (Signed)
[place]  [address] [place], [address]   COLONOSCOPY PROCEDURE REPORT  PATIENT: Patricia Ware, Patricia Ware  MR#: [id number] BIRTHDATE: Jun 29, 1965 , 48  yrs. old GENDER: female ENDOSCOPIST: Lafayette Dragon, MD REFERRED [place]:[facility] Maudie Mercury, [place]:  10/04/2014 PROCEDURE:   Colonoscopy, surveillance and Colonoscopy with cold biopsy polypectomy First Screening Colonoscopy - Avg.  risk and is 50 yrs.  old or older - No.  Prior Negative Screening - Now for repeat screening. N/A  History of Adenoma - Now for follow-up colonoscopy & has been > or = to 3 yrs.  Yes hx of adenoma.  Has been 3 or more years since last colonoscopy.  Polyps removed today? Yes ASA CLASS:   Class II INDICATIONS:Surveillance due to prior colonic neoplasia and Colorectal Neoplasm Risk Assessment for this procedure is average risk. MEDICATIONS: Monitored anesthesia care and Propofol 450 mg IV  DESCRIPTION OF PROCEDURE:   After the risks benefits and alternatives of the procedure were thoroughly explained, informed consent was obtained.  The digital rectal exam revealed no abnormalities of the rectum.   The LB [id number]  endoscope was introduced through the anus and advanced to the cecum, which was identified by both the appendix and ileocecal valve. No adverse events experienced.   The quality of the prep was good.  (MoviPrep was used)  The instrument was then slowly withdrawn as the colon was fully examined. Estimated blood loss is zero unless otherwise noted in this procedure report.      COLON FINDINGS: A firm sessile polyp measuring 5 mm in size was found in the sigmoid colon.  A polypectomy was performed with cold forceps.  The resection was complete, the polyp tissue was completely retrieved and sent to histology.   Small internal Grade I hemorrhoids were found.  Retroflexed views revealed no abnormalities. The time to cecum = 19.20 Withdrawal time = 7.0 The scope was  withdrawn and the procedure completed. COMPLICATIONS: There were no immediate complications.  ENDOSCOPIC IMPRESSION: 1.   Sessile polyp was found in the sigmoid colon; polypectomy was performed with cold forceps 2.   Small internal Grade I hemorrhoids  RECOMMENDATIONS: 1.  Await pathology results 2.  High fiber diet Recall colonoscopy pending path report  eSigned:  Lafayette Dragon, MD 10/04/2014 10:48 AM   cc:   PATIENT NAME:  Patricia Ware MR#: [id number]

## 2014-10-04 NOTE — Progress Notes (Signed)
Called to room to assist during endoscopic procedure.  Patient ID and intended procedure confirmed with present staff. Received instructions for my participation in the procedure from the performing physician.  

## 2014-10-05 ENCOUNTER — Telehealth: Payer: Self-pay | Admitting: *Deleted

## 2014-10-05 NOTE — Telephone Encounter (Signed)
  Follow up Call-  Call back number 10/04/2014  Post procedure Call Back phone  # cell [phone]  Permission to leave phone message Yes     Patient questions:  Do you have a fever, pain , or abdominal swelling? No. Pain Score  0 *  Have you tolerated food without any problems? Yes.    Have you been able to return to your normal activities? Yes.    Do you have any questions about your discharge instructions: Diet   No. Medications  No. Follow up visit  No.  Do you have questions or concerns about your Care? No.  Actions: * If pain score is 4 or above: No action needed, pain <4.

## 2014-10-10 ENCOUNTER — Encounter: Payer: Self-pay | Admitting: Internal Medicine

## 2014-10-30 ENCOUNTER — Other Ambulatory Visit (INDEPENDENT_AMBULATORY_CARE_PROVIDER_SITE_OTHER): Payer: BLUE CROSS/BLUE SHIELD

## 2014-10-30 DIAGNOSIS — E559 Vitamin D deficiency, unspecified: Secondary | ICD-10-CM

## 2014-10-30 DIAGNOSIS — E213 Hyperparathyroidism, unspecified: Secondary | ICD-10-CM | POA: Diagnosis not present

## 2014-10-30 LAB — VITAMIN D 25 HYDROXY (VIT D DEFICIENCY, FRACTURES): VITD: 27.98 ng/mL — ABNORMAL LOW (ref 30.00–100.00)

## 2014-10-31 NOTE — Addendum Note (Signed)
Addended by: Philemon Kingdom on: 10/31/2014 07:58 AM   Modules accepted: Orders, Level of Service

## 2014-11-01 ENCOUNTER — Other Ambulatory Visit (INDEPENDENT_AMBULATORY_CARE_PROVIDER_SITE_OTHER): Payer: BLUE CROSS/BLUE SHIELD

## 2014-11-01 DIAGNOSIS — E213 Hyperparathyroidism, unspecified: Secondary | ICD-10-CM

## 2014-11-01 LAB — BASIC METABOLIC PANEL
BUN: 11 mg/dL (ref 6–23)
CHLORIDE: 106 meq/L (ref 96–112)
CO2: 29 mEq/L (ref 19–32)
CREATININE: 0.69 mg/dL (ref 0.40–1.20)
Calcium: 10.2 mg/dL (ref 8.4–10.5)
GFR: 95.69 mL/min (ref >60.00)
Glucose, Bld: 98 mg/dL (ref 70–99)
Potassium: 3.9 mEq/L (ref 3.5–5.1)
Sodium: 140 mEq/L (ref 135–145)

## 2014-11-01 LAB — PHOSPHORUS: Phosphorus: 2.3 mg/dL (ref 2.3–4.6)

## 2014-11-01 LAB — MAGNESIUM: Magnesium: 1.9 mg/dL (ref 1.5–2.5)

## 2014-11-02 LAB — PARATHYROID HORMONE, INTACT (NO CA): PTH: 132 pg/mL — ABNORMAL HIGH (ref 14–64)

## 2014-11-03 LAB — VITAMIN D 1,25 DIHYDROXY
VITAMIN D 1, 25 (OH) TOTAL: 113 pg/mL — AB (ref 18–72)
VITAMIN D3 1, 25 (OH): 113 pg/mL
Vitamin D2 1, 25 (OH)2: <8 pg/mL

## 2014-11-06 ENCOUNTER — Encounter: Payer: Self-pay | Admitting: Internal Medicine

## 2014-11-06 NOTE — Addendum Note (Signed)
Addended by: Philemon Kingdom on: 11/06/2014 07:56 AM   Modules accepted: Orders, Level of Service

## 2014-11-06 NOTE — Addendum Note (Signed)
Addended by: Philemon Kingdom on: 11/06/2014 04:15 PM   Modules accepted: Level of Service

## 2014-11-20 ENCOUNTER — Other Ambulatory Visit: Payer: Self-pay | Admitting: Surgery

## 2014-11-20 DIAGNOSIS — E213 Hyperparathyroidism, unspecified: Secondary | ICD-10-CM

## 2014-12-05 ENCOUNTER — Encounter (HOSPITAL_COMMUNITY)
Admission: RE | Admit: 2014-12-05 | Discharge: 2014-12-05 | Disposition: A | Discharge status: Home / Self Care | Payer: BLUE CROSS/BLUE SHIELD | Source: Ambulatory Visit | Attending: Surgery | Admitting: Surgery

## 2014-12-05 DIAGNOSIS — E213 Hyperparathyroidism, unspecified: Secondary | ICD-10-CM | POA: Insufficient documentation

## 2014-12-05 MED ORDER — TECHNETIUM TC 99M SESTAMIBI - CARDIOLITE
26.8000 | Freq: Once | INTRAVENOUS | Status: AC | PRN
  Administered 2014-12-05: 10:00:00 26.8 via INTRAVENOUS

## 2014-12-13 ENCOUNTER — Ambulatory Visit: Payer: Self-pay | Admitting: Surgery

## 2015-01-03 ENCOUNTER — Encounter (HOSPITAL_COMMUNITY)
Admission: RE | Admit: 2015-01-03 | Discharge: 2015-01-03 | Disposition: A | Discharge status: Home / Self Care | Payer: BLUE CROSS/BLUE SHIELD | Source: Ambulatory Visit | Attending: Surgery | Admitting: Surgery

## 2015-01-03 ENCOUNTER — Other Ambulatory Visit (HOSPITAL_COMMUNITY): Payer: Self-pay | Admitting: *Deleted

## 2015-01-03 ENCOUNTER — Encounter (HOSPITAL_COMMUNITY): Payer: Self-pay

## 2015-01-03 DIAGNOSIS — E210 Primary hyperparathyroidism: Secondary | ICD-10-CM | POA: Diagnosis not present

## 2015-01-03 DIAGNOSIS — Z01812 Encounter for preprocedural laboratory examination: Secondary | ICD-10-CM | POA: Insufficient documentation

## 2015-01-03 LAB — CBC
HCT: 41.1 % (ref 36.0–46.0)
HEMOGLOBIN: 13.6 g/dL (ref 12.0–15.0)
MCH: 29.8 pg (ref 26.0–34.0)
MCHC: 33.1 g/dL (ref 30.0–36.0)
MCV: 89.9 fL (ref 78.0–100.0)
PLATELETS: 191 10*3/uL (ref 150–400)
RBC: 4.57 MIL/uL (ref 3.87–5.11)
RDW: 12.4 % (ref 11.5–15.5)
WBC: 5 10*3/uL (ref 4.0–10.5)

## 2015-01-03 LAB — BASIC METABOLIC PANEL
ANION GAP: 6 (ref 5–15)
BUN: 10 mg/dL (ref 6–20)
CALCIUM: 10.5 mg/dL — AB (ref 8.9–10.3)
CO2: 29 mmol/L (ref 22–32)
CREATININE: 0.69 mg/dL (ref 0.44–1.00)
Chloride: 102 mmol/L (ref 101–111)
GFR calc non Af Amer: >60 mL/min (ref >60)
Glucose, Bld: 97 mg/dL (ref 65–99)
Potassium: 4.4 mmol/L (ref 3.5–5.1)
SODIUM: 137 mmol/L (ref 135–145)

## 2015-01-03 NOTE — Progress Notes (Signed)
Pt denies any cardiac history, chest pain or sob. Pt does have a family history of Pulmonary Embolus on father's side. Her father, her uncle and her aunt (Dad's brother and sister) have all had a PE. Her aunt died from a PE after surgery.

## 2015-01-03 NOTE — Pre-Procedure Instructions (Signed)
Patricia Ware  01/03/2015     Your procedure is scheduled on Monday, January 08, 2015 at 10:00 AM.   Report to [facility] Entrance "A" Admitting Office at 8:00 AM.   Call this number if you have problems the morning of surgery: [phone]   Any questions prior to day of surgery, please call [phone] between 8 & 4 PM.    Remember:  Do not eat food or drink liquids after midnight Sunday, 01/07/15.  Take these medicines the morning of surgery with A SIP OF WATER: None   Do not wear jewelry, make-up or nail polish.  Do not wear lotions, powders, or perfumes.  You may wear deodorant.  Do not shave 48 hours prior to surgery.    Do not bring valuables to the hospital.  [facility] is not responsible for any belongings or valuables.  Contacts, dentures or bridgework may not be worn into surgery.  Leave your suitcase in the car.  After surgery it may be brought to your room.  For patients admitted to the hospital, discharge time will be determined by your treatment team.  Patients discharged the day of surgery will not be allowed to drive home.   Special instructions:  [place] - Preparing for Surgery  Before surgery, you can play an important role.  Because skin is not sterile, your skin needs to be as free of germs as possible.  You can reduce the number of germs on you skin by washing with CHG (chlorahexidine gluconate) soap before surgery.  CHG is an antiseptic cleaner which kills germs and bonds with the skin to continue killing germs even after washing.  Please DO NOT use if you have an allergy to CHG or antibacterial soaps.  If your skin becomes reddened/irritated stop using the CHG and inform your nurse when you arrive at Short Stay.  Do not shave (including legs and underarms) for at least 48 hours prior to the first CHG shower.  You may shave your face.  Please follow these instructions carefully:   1.  Shower with CHG Soap the night before surgery and  the                                morning of Surgery.  2.  If you choose to wash your hair, wash your hair first as usual with your       normal shampoo.  3.  After you shampoo, rinse your hair and body thoroughly to remove the                      Shampoo.  4.  Use CHG as you would any other liquid soap.  You can apply chg directly       to the skin and wash gently with scrungie or a clean washcloth.  5.  Apply the CHG Soap to your body ONLY FROM THE NECK DOWN.        Do not use on open wounds or open sores.  Avoid contact with your eyes, ears, mouth and genitals (private parts).  Wash genitals (private parts) with your normal soap.  6.  Wash thoroughly, paying special attention to the area where your surgery        will be performed.  7.  Thoroughly rinse your body with warm water from the neck down.  8.  DO NOT shower/wash with your normal soap  after using and rinsing off       the CHG Soap.  9.  Pat yourself dry with a clean towel.            10.  Wear clean pajamas.            11.  Place clean sheets on your bed the night of your first shower and do not        sleep with pets.  Day of Surgery  Do not apply any lotions the morning of surgery.  Please wear clean clothes to the hospital.   Please read over the following fact sheets that you were given. Pain Booklet, Coughing and Deep Breathing and Surgical Site Infection Prevention

## 2015-01-06 ENCOUNTER — Telehealth: Payer: Self-pay | Admitting: General Surgery

## 2015-01-06 NOTE — Progress Notes (Signed)
Patient called reporting sore throat with temp of 100.3. Patient reports that her husband was sick last week and had to go on antibiotics. Reports that PCP does not have weekend office hours. Recommended her going to urgent care and that contact [facility] for on call physician. Patient verbalized understanding.

## 2015-01-06 NOTE — Telephone Encounter (Signed)
Patricia Ware has a sore throat, her husband just got over strep throat she thinks. She will go to urgent care today.   She is scheduled for neck surgery Monday morning -if she continues to worsen she will call tomorrow to cancel

## 2015-01-07 ENCOUNTER — Encounter: Payer: Self-pay | Admitting: General Surgery

## 2015-01-07 NOTE — Progress Notes (Unsigned)
She called today stating that over the past 30 hours she developed a severe sore throat along with low-grade fever and was feeling poorly. She had been seen at urgent care yesterday. She is scheduled for elective parathyroidectomy tomorrow. I told her given the acute onset of her febrile illness, I felt we should cancel the operation. I told her we would have the office call her back and reschedule her surgery when she was feeling better.

## 2015-01-08 ENCOUNTER — Ambulatory Visit (HOSPITAL_COMMUNITY): Admission: RE | Admit: 2015-01-08 | Payer: BLUE CROSS/BLUE SHIELD | Source: Ambulatory Visit | Admitting: Surgery

## 2015-01-08 ENCOUNTER — Encounter (HOSPITAL_COMMUNITY): Admission: RE | Payer: Self-pay | Source: Ambulatory Visit

## 2015-01-08 SURGERY — PARATHYROIDECTOMY
Anesthesia: General | Laterality: Right

## 2015-01-09 ENCOUNTER — Ambulatory Visit: Payer: Self-pay | Admitting: Surgery

## 2015-01-09 ENCOUNTER — Encounter (HOSPITAL_COMMUNITY): Payer: Self-pay | Admitting: *Deleted

## 2015-01-09 NOTE — Progress Notes (Signed)
Need obtain consent order for 01-19-15 surgery

## 2015-01-19 ENCOUNTER — Ambulatory Visit (HOSPITAL_COMMUNITY)
Admission: RE | Admit: 2015-01-19 | Discharge: 2015-01-19 | Disposition: A | Discharge status: Home / Self Care | Payer: BLUE CROSS/BLUE SHIELD | Source: Ambulatory Visit | Attending: Surgery | Admitting: Surgery

## 2015-01-19 ENCOUNTER — Encounter (HOSPITAL_COMMUNITY): Payer: Self-pay | Admitting: Surgery

## 2015-01-19 ENCOUNTER — Ambulatory Visit (HOSPITAL_COMMUNITY): Payer: BLUE CROSS/BLUE SHIELD | Admitting: Certified Registered Nurse Anesthetist

## 2015-01-19 ENCOUNTER — Encounter (HOSPITAL_COMMUNITY)
Admission: RE | Disposition: A | Discharge status: Home / Self Care | Payer: Self-pay | Source: Ambulatory Visit | Attending: Surgery

## 2015-01-19 DIAGNOSIS — Z79899 Other long term (current) drug therapy: Secondary | ICD-10-CM | POA: Insufficient documentation

## 2015-01-19 DIAGNOSIS — N200 Calculus of kidney: Secondary | ICD-10-CM | POA: Insufficient documentation

## 2015-01-19 DIAGNOSIS — F329 Major depressive disorder, single episode, unspecified: Secondary | ICD-10-CM | POA: Insufficient documentation

## 2015-01-19 DIAGNOSIS — F419 Anxiety disorder, unspecified: Secondary | ICD-10-CM | POA: Insufficient documentation

## 2015-01-19 DIAGNOSIS — D351 Benign neoplasm of parathyroid gland: Secondary | ICD-10-CM | POA: Insufficient documentation

## 2015-01-19 DIAGNOSIS — E210 Primary hyperparathyroidism: Secondary | ICD-10-CM | POA: Diagnosis present

## 2015-01-19 HISTORY — DX: Reserved for inherently not codable concepts without codable children: IMO0001

## 2015-01-19 HISTORY — PX: PARATHYROIDECTOMY: SHX19

## 2015-01-19 SURGERY — PARATHYROIDECTOMY
Anesthesia: General | Site: Neck | Laterality: Right

## 2015-01-19 MED ORDER — HYDROMORPHONE HCL 1 MG/ML IJ SOLN
INTRAMUSCULAR | Status: AC
  Administered 2015-01-19: 0.5 mg via INTRAVENOUS
  Filled 2015-01-19: qty 1

## 2015-01-19 MED ORDER — PROPOFOL 10 MG/ML IV BOLUS
INTRAVENOUS | Status: AC
  Filled 2015-01-19: qty 20

## 2015-01-19 MED ORDER — SUCCINYLCHOLINE CHLORIDE 20 MG/ML IJ SOLN
INTRAMUSCULAR | Status: DC | PRN
  Administered 2015-01-19: 50 mg via INTRAVENOUS

## 2015-01-19 MED ORDER — LIDOCAINE HCL (CARDIAC) 20 MG/ML IV SOLN
INTRAVENOUS | Status: DC | PRN
  Administered 2015-01-19: 50 mg via INTRAVENOUS

## 2015-01-19 MED ORDER — HYDROCODONE-ACETAMINOPHEN 5-325 MG PO TABS: 1.0000 | ORAL_TABLET | ORAL | Status: DC | PRN

## 2015-01-19 MED ORDER — MIDAZOLAM HCL 2 MG/2ML IJ SOLN
INTRAMUSCULAR | Status: AC
  Filled 2015-01-19: qty 4

## 2015-01-19 MED ORDER — 0.9 % SODIUM CHLORIDE (POUR BTL) OPTIME
TOPICAL | Status: DC | PRN
  Administered 2015-01-19: 1000 mL

## 2015-01-19 MED ORDER — ONDANSETRON HCL 4 MG/2ML IJ SOLN
INTRAMUSCULAR | Status: DC | PRN
  Administered 2015-01-19: 4 mg via INTRAVENOUS

## 2015-01-19 MED ORDER — FENTANYL CITRATE (PF) 250 MCG/5ML IJ SOLN
INTRAMUSCULAR | Status: AC
  Filled 2015-01-19: qty 25

## 2015-01-19 MED ORDER — ONDANSETRON HCL 4 MG/2ML IJ SOLN
INTRAMUSCULAR | Status: AC
  Filled 2015-01-19: qty 2

## 2015-01-19 MED ORDER — CEFAZOLIN SODIUM-DEXTROSE 2-3 GM-% IV SOLR
INTRAVENOUS | Status: AC
  Filled 2015-01-19: qty 50

## 2015-01-19 MED ORDER — PROMETHAZINE HCL 25 MG/ML IJ SOLN
6.2500 mg | INTRAMUSCULAR | Status: DC | PRN
  Administered 2015-01-19: 6.25 mg via INTRAVENOUS
  Filled 2015-01-19: qty 1

## 2015-01-19 MED ORDER — HYDROMORPHONE HCL 1 MG/ML IJ SOLN
0.2500 mg | INTRAMUSCULAR | Status: DC | PRN
  Administered 2015-01-19: 0.5 mg via INTRAVENOUS
  Administered 2015-01-19 (×2): 0.25 mg via INTRAVENOUS
  Administered 2015-01-19 (×2): 0.5 mg via INTRAVENOUS

## 2015-01-19 MED ORDER — BUPIVACAINE HCL 0.25 % IJ SOLN
INTRAMUSCULAR | Status: DC | PRN
  Administered 2015-01-19: 8.5 mL

## 2015-01-19 MED ORDER — GLYCOPYRROLATE 0.2 MG/ML IJ SOLN
INTRAMUSCULAR | Status: AC
  Filled 2015-01-19: qty 3

## 2015-01-19 MED ORDER — CEFAZOLIN SODIUM-DEXTROSE 2-3 GM-% IV SOLR
2.0000 g | INTRAVENOUS | Status: AC
  Administered 2015-01-19: 2 g via INTRAVENOUS

## 2015-01-19 MED ORDER — NEOSTIGMINE METHYLSULFATE 10 MG/10ML IV SOLN
INTRAVENOUS | Status: AC
  Filled 2015-01-19: qty 1

## 2015-01-19 MED ORDER — LACTATED RINGERS IV SOLN
INTRAVENOUS | Status: DC
  Administered 2015-01-19: 14:00:00 via INTRAVENOUS
  Administered 2015-01-19: 1000 mL via INTRAVENOUS

## 2015-01-19 MED ORDER — GLYCOPYRROLATE 0.2 MG/ML IJ SOLN
INTRAMUSCULAR | Status: DC | PRN
  Administered 2015-01-19: .6 mg via INTRAVENOUS

## 2015-01-19 MED ORDER — FENTANYL CITRATE (PF) 100 MCG/2ML IJ SOLN
INTRAMUSCULAR | Status: DC | PRN
  Administered 2015-01-19: 50 ug via INTRAVENOUS
  Administered 2015-01-19: 100 ug via INTRAVENOUS
  Administered 2015-01-19 (×2): 50 ug via INTRAVENOUS

## 2015-01-19 MED ORDER — PROPOFOL 10 MG/ML IV BOLUS
INTRAVENOUS | Status: DC | PRN
  Administered 2015-01-19: 150 mg via INTRAVENOUS
  Administered 2015-01-19: 50 mg via INTRAVENOUS

## 2015-01-19 MED ORDER — ACETAMINOPHEN 10 MG/ML IV SOLN
1000.0000 mg | Freq: Once | INTRAVENOUS | Status: AC
Start: 2015-01-19 — End: 2015-01-19
  Administered 2015-01-19: 1000 mg via INTRAVENOUS

## 2015-01-19 MED ORDER — ACETAMINOPHEN 10 MG/ML IV SOLN
INTRAVENOUS | Status: AC
  Filled 2015-01-19: qty 100

## 2015-01-19 MED ORDER — LIDOCAINE HCL (CARDIAC) 20 MG/ML IV SOLN
INTRAVENOUS | Status: AC
  Filled 2015-01-19: qty 5

## 2015-01-19 MED ORDER — BUPIVACAINE-EPINEPHRINE (PF) 0.25% -1:200000 IJ SOLN
INTRAMUSCULAR | Status: AC
  Filled 2015-01-19: qty 30

## 2015-01-19 MED ORDER — ROCURONIUM BROMIDE 100 MG/10ML IV SOLN
INTRAVENOUS | Status: AC
  Filled 2015-01-19: qty 1

## 2015-01-19 MED ORDER — ROCURONIUM BROMIDE 100 MG/10ML IV SOLN
INTRAVENOUS | Status: DC | PRN
  Administered 2015-01-19: 10 mg via INTRAVENOUS
  Administered 2015-01-19: 20 mg via INTRAVENOUS

## 2015-01-19 MED ORDER — NEOSTIGMINE METHYLSULFATE 10 MG/10ML IV SOLN
INTRAVENOUS | Status: DC | PRN
Start: 2015-01-19 — End: 2015-01-19
  Administered 2015-01-19: 4 mg via INTRAVENOUS

## 2015-01-19 MED ORDER — MIDAZOLAM HCL 5 MG/5ML IJ SOLN
INTRAMUSCULAR | Status: DC | PRN
  Administered 2015-01-19: 2 mg via INTRAVENOUS

## 2015-01-19 MED ORDER — HYDROMORPHONE HCL 1 MG/ML IJ SOLN
INTRAMUSCULAR | Status: AC
  Administered 2015-01-19: 0.25 mg via INTRAVENOUS
  Filled 2015-01-19: qty 1

## 2015-01-19 SURGICAL SUPPLY — 38 items
ATTRACTOMAT 16X20 MAGNETIC DRP (DRAPES) ×3 IMPLANT
BENZOIN TINCTURE PRP APPL 2/3 (GAUZE/BANDAGES/DRESSINGS) IMPLANT
BLADE HEX COATED 2.75 (ELECTRODE) ×3 IMPLANT
BLADE SURG 15 STRL LF DISP TIS (BLADE) ×1 IMPLANT
BLADE SURG 15 STRL SS (BLADE) ×2
CHLORAPREP W/TINT 26ML (MISCELLANEOUS) ×3 IMPLANT
CLIP TI MEDIUM 6 (CLIP) ×6 IMPLANT
CLIP TI WIDE RED SMALL 6 (CLIP) ×6 IMPLANT
CLOSURE WOUND 1/2 X4 (GAUZE/BANDAGES/DRESSINGS) ×1
COVER SURGICAL LIGHT HANDLE (MISCELLANEOUS) ×3 IMPLANT
DRAPE LAPAROTOMY T 98X78 PEDS (DRAPES) ×3 IMPLANT
DRESSING SURGICEL FIBRLLR 1X2 (HEMOSTASIS) ×1 IMPLANT
DRSG SURGICEL FIBRILLAR 1X2 (HEMOSTASIS) ×3
ELECT PENCIL ROCKER SW 15FT (MISCELLANEOUS) ×3 IMPLANT
ELECT REM PT RETURN 9FT ADLT (ELECTROSURGICAL) ×3
ELECTRODE REM PT RTRN 9FT ADLT (ELECTROSURGICAL) ×1 IMPLANT
GAUZE SPONGE 4X4 12PLY STRL (GAUZE/BANDAGES/DRESSINGS) ×3 IMPLANT
GAUZE SPONGE 4X4 16PLY XRAY LF (GAUZE/BANDAGES/DRESSINGS) ×3 IMPLANT
GLOVE SURG ORTHO 8.0 STRL STRW (GLOVE) ×3 IMPLANT
GOWN STRL REUS W/TWL XL LVL3 (GOWN DISPOSABLE) ×9 IMPLANT
KIT BASIN OR (CUSTOM PROCEDURE TRAY) ×3 IMPLANT
LIQUID BAND (GAUZE/BANDAGES/DRESSINGS) IMPLANT
NEEDLE HYPO 25X1 1.5 SAFETY (NEEDLE) ×3 IMPLANT
PACK BASIC VI WITH GOWN DISP (CUSTOM PROCEDURE TRAY) ×3 IMPLANT
STAPLER VISISTAT 35W (STAPLE) ×3 IMPLANT
STRIP CLOSURE SKIN 1/2X4 (GAUZE/BANDAGES/DRESSINGS) ×2 IMPLANT
SUT MNCRL AB 4-0 PS2 18 (SUTURE) ×3 IMPLANT
SUT SILK 2 0 (SUTURE)
SUT SILK 2-0 18XBRD TIE 12 (SUTURE) IMPLANT
SUT SILK 3 0 (SUTURE)
SUT SILK 3-0 18XBRD TIE 12 (SUTURE) IMPLANT
SUT VIC AB 3-0 SH 18 (SUTURE) ×3 IMPLANT
SYR BULB IRRIGATION 50ML (SYRINGE) ×3 IMPLANT
SYR CONTROL 10ML LL (SYRINGE) ×3 IMPLANT
TAPE CLOTH SURG 4X10 WHT LF (GAUZE/BANDAGES/DRESSINGS) ×3 IMPLANT
TOWEL OR 17X26 10 PK STRL BLUE (TOWEL DISPOSABLE) ×3 IMPLANT
TOWEL OR NON WOVEN STRL DISP B (DISPOSABLE) ×3 IMPLANT
YANKAUER SUCT BULB TIP 10FT TU (MISCELLANEOUS) ×3 IMPLANT

## 2015-01-19 NOTE — Anesthesia Postprocedure Evaluation (Signed)
  Anesthesia Post-op Note  Patient: Patricia Ware  Procedure(s) Performed: Procedure(s) (LRB): RIGHT INFERIOR PARATHYROIDECTOMY (Right)  Patient Location: PACU  Anesthesia Type: General  Level of Consciousness: awake and alert   Airway and Oxygen Therapy: Patient Spontanous Breathing  Post-op Pain: mild  Post-op Assessment: Post-op Vital signs reviewed, Patient's Cardiovascular Status Stable, Respiratory Function Stable, Patent Airway and No signs of Nausea or vomiting  Last Vitals:  Filed Vitals:   01/19/15 1634  BP: 118/73  Pulse: 79  Temp:   Resp: 16    Post-op Vital Signs: stable   Complications: No apparent anesthesia complications

## 2015-01-19 NOTE — Op Note (Signed)
OPERATIVE REPORT - PARATHYROIDECTOMY  Preoperative diagnosis: Primary hyperparathyroidism  Postop diagnosis: Same  Procedure: Right inferior minimally invasive parathyroidectomy  Surgeon:  Earnstine Regal, MD, FACS  Anesthesia: Gen. endotracheal  Estimated blood loss: Minimal  Preparation: ChloraPrep  Indications: The patient is a 49 year old female who presents with a parathyroid neoplasm. Patient is referred by Dr. Philemon Kingdom for evaluation of primary hyperparathyroidism. Patient was originally diagnosed with hypercalcemia while living in [place]. She relocated to [facility] and her primary doctor again noted hypercalcemia. Patient has developed nephrolithiasis. She was referred to endocrinology. Calcium levels have ranged from 10.2-10.5. Intact PTH level is elevated from 119-132. 24-hour urine collection for calcium was reportedly elevated. Vitamin D levels are reportedly normal. Nuclear medicine parathyroid scan localizes an adenoma to the right inferior position.  Patient now presents for parathyroidectomy.  Procedure: Patient was prepared in the holding area. He was brought to operating room and placed in a supine position on the operating room table. Following administration of general anesthesia, the patient was positioned and then prepped and draped in the usual strict aseptic fashion. After ascertaining that an adequate level of anesthesia been achieved, a neck incision was made with a #15 blade. Dissection was carried through subcutaneous tissues and platysma. Hemostasis was obtained with the electrocautery. Skin flaps were developed circumferentially and a Weitlander retractor was placed for exposure.  Strap muscles were incised in the midline. Strap muscles were reflected exposing the thyroid lobe. With gentle blunt dissection the thyroid lobe was mobilized.  Dissection was carried through adipose tissue and an enlarged nodule was identified adjacent to  the carotid artery and vagus nerve. It was gently mobilized. Vascular structures were divided between small and medium ligaclips. Care was taken to avoid the recurrent laryngeal nerve, the vagus nerve, and the esophagus. The nodule was completely excised. It was submitted to pathology where frozen section confirmed benign lymph node.  Additional dissection revealed a nodule on the inferior pole of the thyroid gland measuring approximately 2 cm in sized.  This was dissected off of the thyroid and vascular structures were divided between ligaclips.  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  Neck was irrigated with warm saline and good hemostasis was noted. Fibrillar was placed in the operative field. Strap muscles were reapproximated in the midline with interrupted 3-0 Vicryl sutures. Platysma was closed with interrupted 3-0 Vicryl sutures. Skin was closed with a running 4-0 Monocryl subcuticular suture. Marcaine was infiltrated circumferentially. Wound was washed and dried and benzoin and Steri-Strips were applied. Sterile gauze dressings were applied. Patient was awakened from anesthesia and brought to the recovery room. The patient tolerated the procedure well.   Earnstine Regal, MD, [facility]

## 2015-01-19 NOTE — Transfer of Care (Signed)
Immediate Anesthesia Transfer of Care Note  Patient: Patricia Ware  Procedure(s) Performed: Procedure(s): RIGHT INFERIOR PARATHYROIDECTOMY (Right)  Patient Location: PACU  Anesthesia Type:General  Level of Consciousness: awake, alert  and oriented  Airway & Oxygen Therapy: Patient Spontanous Breathing and Patient connected to face mask oxygen  Post-op Assessment: Report given to RN and Post -op Vital signs reviewed and stable  Post vital signs: Reviewed and stable  Last Vitals:  Filed Vitals:   01/19/15 0924  BP: 127/85  Pulse: 89  Temp: 36.8 C  Resp: 18    Complications: No apparent anesthesia complications

## 2015-01-19 NOTE — H&P (Signed)
General Surgery [facility]  Patricia Ware DOB: 20-Nov-1965 Married / Language: English / Race: White Female  History of Present Illness  The patient is a 49 year old female who presents with a parathyroid neoplasm. Patient is referred by Dr. Philemon Kingdom for evaluation of primary hyperparathyroidism. Patient was originally diagnosed with hypercalcemia while living in [place]. She relocated to [facility] and her primary doctor again noted hypercalcemia. Patient has developed nephrolithiasis. She was referred to endocrinology. Calcium levels have ranged from 10.2-10.5. Intact PTH level is elevated from 119-132. 24-hour urine collection for calcium was reportedly elevated. Vitamin D levels are reportedly normal. Patient has not had any imaging studies. She has not had a bone density scan. Patient has had no prior surgery on the head or neck other than tonsillectomy. There is no family history of parathyroid disease and no family history of other endocrine neoplasms.   Other Problems  Anxiety Disorder Back Pain Hemorrhoids Kidney Stone Umbilical Hernia Repair  Past Surgical History Cesarean Section - 1 Colon Polyp Removal - Colonoscopy Tonsillectomy Ventral / Umbilical Hernia Surgery Bilateral.  Diagnostic Studies History Colonoscopy within last year Mammogram within last year Pap Smear 1-5 years ago  Allergies  No Known Drug Allergies08/04/2014  Medication History  Vitamin D2 (2000UNIT Tablet, Oral) Active. Lactobacillus Extra Strength (Oral) Active. Medications Reconciled  Social History Alcohol use Occasional alcohol use. Caffeine use Coffee. No drug use Tobacco use Never smoker.  Family History Arthritis Mother. Cancer Family Members In General. Colon Polyps Father. Hypertension Father. Ischemic Bowel Disease Father.  Pregnancy / Birth History Age at menarche 12 years. Age of menopause  64-50 Contraceptive History Oral contraceptives. Gravida 1 Irregular periods Maternal age 76-40 Para 1  Review of Systems General Present- Chills, Fatigue and Weight Gain. Not Present- Appetite Loss, Fever, Night Sweats and Weight Loss. Skin Not Present- Change in Wart/Mole, Dryness, Hives, Jaundice, New Lesions, Non-Healing Wounds, Rash and Ulcer. HEENT Not Present- Earache, Hearing Loss, Hoarseness, Nose Bleed, Oral Ulcers, Ringing in the Ears, Seasonal Allergies, Sinus Pain, Sore Throat, Visual Disturbances, Wears glasses/contact lenses and Yellow Eyes. Respiratory Not Present- Bloody sputum, Chronic Cough, Difficulty Breathing, Snoring and Wheezing. Breast Not Present- Breast Mass, Breast Pain, Nipple Discharge and Skin Changes. Cardiovascular Present- Palpitations. Not Present- Chest Pain, Difficulty Breathing Lying Down, Leg Cramps, Rapid Heart Rate, Shortness of Breath and Swelling of Extremities. Gastrointestinal Present- Bloating, Hemorrhoids and Indigestion. Not Present- Abdominal Pain, Bloody Stool, Change in Bowel Habits, Chronic diarrhea, Constipation, Difficulty Swallowing, Excessive gas, Gets full quickly at meals, Nausea, Rectal Pain and Vomiting. Female Genitourinary Not Present- Frequency, Nocturia, Painful Urination, Pelvic Pain and Urgency. Musculoskeletal Present- Back Pain and Joint Pain. Not Present- Joint Stiffness, Muscle Pain, Muscle Weakness and Swelling of Extremities. Neurological Present- Decreased Memory, Numbness and Tingling. Not Present- Fainting, Headaches, Seizures, Tremor, Trouble walking and Weakness. Psychiatric Present- Anxiety and Depression. Not Present- Bipolar, Change in Sleep Pattern, Fearful and Frequent crying. Endocrine Present- Heat Intolerance. Not Present- Cold Intolerance, Excessive Hunger, Hair Changes, Hot flashes and New Diabetes. Hematology Not Present- Easy Bruising, Excessive bleeding, Gland problems, HIV and Persistent  Infections.   Vitals Weight: 173 lb Height: 68in Body Surface Area: 1.94 m Body Mass Index: 26.3 kg/m Temp.: 98.39F(Oral)  Pulse: 93 (Regular)  Resp.: 17 (Unlabored)  BP: 126/70 (Sitting, Left Arm, Standard)   Physical Exam  General - appears comfortable, no distress; not diaphorectic  HEENT - normocephalic; sclerae clear, gaze conjugate; mucous membranes moist, dentition good; voice  normal  Neck - symmetric on extension; no palpable anterior or posterior cervical adenopathy; no palpable masses in the thyroid bed  Chest - clear bilaterally without rhonchi, rales, or wheeze  Cor - regular rhythm with normal rate; no significant murmur  Ext - non-tender without significant edema or lymphedema  Neuro - grossly intact; no tremor    Assessment & Plan  PRIMARY HYPERPARATHYROIDISM (252.01  E21.0)  Patient presents with signs and symptoms of primary hyperparathyroidism. I provided her with written literature on parathyroid disease to review at home.  Based on the patient's clinical findings, I would like to proceed with a nuclear medicine parathyroid scan. We will arrange for the study in the near future. Hopefully it will identify a parathyroid adenoma and provide localization data. If so, I believe the patient will be a good candidate for minimally invasive surgery. If the sestamibi scan is not revealing, we will obtain a high-resolution ultrasound.  Patient and I discussed minimally invasive surgery. We discussed the possibility of 4 gland exploration. We discussed the possibility of second gland adenoma. We discussed potential complications from surgery including recurrent laryngeal nerve injury.  Nuclear med parathyroid scan localizes an adenoma to the right inferior position.  Plan minimally invasive surgery.  The risks and benefits of the procedure have been discussed at length with the patient.  The patient understands the proposed procedure, potential  alternative treatments, and the course of recovery to be expected.  All of the patient's questions have been answered at this time.  The patient wishes to proceed with surgery.  Earnstine Regal, MD, [facility] Office: [phone]

## 2015-01-19 NOTE — Anesthesia Preprocedure Evaluation (Addendum)
Anesthesia Evaluation  Patient identified by MRN, date of birth, ID band Patient awake    Reviewed: Allergy & Precautions, NPO status , Patient's Chart, lab work & pertinent test results  Airway Mallampati: II  TM Distance: >3 FB Neck ROM: Full    Dental no notable dental hx.    Pulmonary neg pulmonary ROS,    Pulmonary exam normal breath sounds clear to auscultation       Cardiovascular negative cardio ROS Normal cardiovascular exam Rhythm:Regular Rate:Normal     Neuro/Psych PSYCHIATRIC DISORDERS Anxiety Depression negative neurological ROS     GI/Hepatic negative GI ROS, Neg liver ROS,   Endo/Other  negative endocrine ROS  Renal/GU Renal diseaseH/O kidney stones.  negative genitourinary   Musculoskeletal negative musculoskeletal ROS (+)   Abdominal   Peds negative pediatric ROS (+)  Hematology negative hematology ROS (+)   Anesthesia Other Findings   Reproductive/Obstetrics negative OB ROS LMP 06-05-13                            Anesthesia Physical Anesthesia Plan  ASA: II  Anesthesia Plan: General   Post-op Pain Management:    Induction: Intravenous  Airway Management Planned: Oral ETT  Additional Equipment:   Intra-op Plan:   Post-operative Plan: Extubation in OR  Informed Consent: I have reviewed the patients History and Physical, chart, labs and discussed the procedure including the risks, benefits and alternatives for the proposed anesthesia with the patient or authorized representative who has indicated his/her understanding and acceptance.   Dental advisory given  Plan Discussed with: CRNA  Anesthesia Plan Comments:         Anesthesia Quick Evaluation

## 2015-01-19 NOTE — Interval H&P Note (Signed)
History and Physical Interval Note:  01/19/2015 12:40 PM  Patricia Ware  has presented today for surgery, with the diagnosis of Primary Hyperparathyroidism.  The various methods of treatment have been discussed with the patient and family. After consideration of risks, benefits and other options for treatment, the patient has consented to    Procedure(s): RIGHT INFERIOR PARATHYROIDECTOMY (Right) as a surgical intervention .    The patient's history has been reviewed, patient examined, no change in status, stable for surgery.  I have reviewed the patient's chart and labs.  Questions were answered to the patient's satisfaction.    Earnstine Regal, MD, [facility] Office: [place]

## 2015-01-19 NOTE — Anesthesia Procedure Notes (Signed)
Procedure Name: Intubation Date/Time: 01/19/2015 12:46 PM Performed by: Dimas Millin, AMY F Pre-anesthesia Checklist: Patient identified, Emergency Drugs available, Suction available, Patient being monitored and Timeout performed Patient Re-evaluated:Patient Re-evaluated prior to inductionOxygen Delivery Method: Circle system utilized Preoxygenation: Pre-oxygenation with 100% oxygen Intubation Type: IV induction Ventilation: Mask ventilation without difficulty Laryngoscope Size: Miller and 2 Grade View: Grade I Tube type: Oral Tube size: 7.0 mm Number of attempts: 1 Airway Equipment and Method: Stylet Placement Confirmation: ETT inserted through vocal cords under direct vision,  positive ETCO2 and breath sounds checked- equal and bilateral Secured at: 24 cm Tube secured with: Tape Dental Injury: Teeth and Oropharynx as per pre-operative assessment

## 2015-02-26 ENCOUNTER — Encounter: Payer: Self-pay | Admitting: Internal Medicine

## 2015-02-26 ENCOUNTER — Ambulatory Visit (INDEPENDENT_AMBULATORY_CARE_PROVIDER_SITE_OTHER): Payer: BLUE CROSS/BLUE SHIELD | Admitting: Internal Medicine

## 2015-02-26 VITALS — BP 108/64 | HR 97 | Temp 98.1°F | Resp 12 | Wt 180.0 lb

## 2015-02-26 DIAGNOSIS — E559 Vitamin D deficiency, unspecified: Secondary | ICD-10-CM | POA: Diagnosis not present

## 2015-02-26 DIAGNOSIS — E210 Primary hyperparathyroidism: Secondary | ICD-10-CM | POA: Diagnosis not present

## 2015-02-26 NOTE — Progress Notes (Signed)
Patient ID: Patricia Ware, female   DOB: [birth year], 49 y.o.   MRN: [id number]  HPI  Patricia Ware is a 49 y.o.-year-old female, returning for follow-up for primary hyperparathyroidism and vitamin D deficiency. Last visit 6 months ago.  Reviewed and addended history: Pt was dx with hypercalcemia in 2014. I reviewed pt's pertinent labs: Lab Results  Component Value Date   PTH 132* 11/01/2014   PTH 119* 06/01/2014   CALCIUM 10.5* 01/03/2015   CALCIUM 10.2 11/01/2014   CALCIUM 10.5 06/01/2014  10/26/2012: Ca 10.2 (8.4-10.5) 09/21/2012: Ca 10.4 (8.7-10.2)  No previous DEXA scans. No fractures or falls.   + h/o kidney stones. First in late 20's, repeated episodes in the last 8 years. She has one now. She saw Urology.  No stone was captured. She had further investigation by urology, and we'll try to get these records.  No h/o CKD. Last BUN/Cr: Lab Results  Component Value Date   BUN 10 01/03/2015   CREATININE 0.69 01/03/2015   Pt is not on HCTZ.  At last visit, we completed investigation for primary hyperparathyroidism, and this was positive:  Component     Latest Ref Rng 11/01/2014  Sodium     135 - 145 mEq/L 140  Potassium     3.5 - 5.1 mEq/L 3.9  Chloride     96 - 112 mEq/L 106  CO2     19 - 32 mEq/L 29  Glucose     70 - 99 mg/dL 98  BUN     6 - 23 mg/dL 11  Creatinine     0.40 - 1.20 mg/dL 0.69  Calcium     8.4 - 10.5 mg/dL 10.2  GFR     >60.00 mL/min 95.69  Vitamin D 1, 25 (OH) Total     18 - 72 pg/mL 113 (H)  Vitamin D3 1, 25 (OH)      113  Vitamin D2 1, 25 (OH)      <8  PTH     14 - 64 pg/mL 132 (H)  Phosphorus     2.3 - 4.6 mg/dL 2.3  Magnesium     1.5 - 2.5 mg/dL 1.9   Records from patient's urologist, Dr. Louis Meckel from 10/10/2014: 24 hour urine calcium was 407 mg a day, which is elevated. Her urine volume was 1.5 L. Calcium oxalate saturation, was also high, at 12.33, ideally <5. She also has an elevated saturation of calcium phosphate  2.27, ideally <1.2.  The elevated PTH ([facility]), elevated 1, 25 dihydroxy vitamin D, history of kidney stones, elevated urinary calcium, calcium at the upper limit of normal, all pointed towards primary hyperparathyroidism. Patient was referred to surgery >> had right inferior parathyroidectomy on 01/19/2015 (Dr. Harlow Asa)). The pathology showed an adenoma of 1.1 g.  She feels much better after the surgery "like a weight has been lifted off on me".  Labs (02/02/2015): Ca 9.1, PTH 30.  She also has a history of vitamin D deficiency: - Latest vitamin D, after starting 2000 units of vitamin D daily: Component     Latest Ref Rng 10/30/2014  VITD     30.00 - 100.00 ng/mL 27.98 (L)    ROS: Constitutional: no weight gain/loss, no fatigue, no subjective hyperthermia/hypothermia Eyes: no blurry vision, no xerophthalmia ENT: no sore throat, no nodules palpated in throat, no dysphagia/odynophagia, no hoarseness Cardiovascular: no CP/SOB/palpitations/leg swelling Respiratory: no cough/SOB Gastrointestinal: no N/V/D/C Musculoskeletal: no muscle/joint aches Skin: no rashes Neurological: no tremors/numbness/tingling/dizziness  I reviewed pt's  medications, allergies, PMH, social hx, family hx, and changes were documented in the history of present illness. Otherwise, unchanged from my initial visit note.  Past Medical History  Diagnosis Date  . Kidney stone   . Colon polyps   . History of UTI   . Hot flashes   . History of chicken pox   . Depression   . Anxiety   . Cold     with low grade fever, surgery cancelled 01-08-15 due to cold   Past Surgical History  Procedure Laterality Date  . Cesarean section  2003  . Umbilical hernia repair  1977  . Tonsillectomy  1992  . Colonoscopy  11-26-2011  . Polypectomy  11-26-2011    sessile serrated polyp  . Parathyroidectomy Right 01/19/2015    Procedure: RIGHT INFERIOR PARATHYROIDECTOMY;  Surgeon: Armandina Gemma, MD;  Location: [facility];  Service: General;   Laterality: Right;   History   Social History  . Marital Status: Married    Spouse Name: N/A  . children 1   Social History Main Topics  . Smoking status: Never Smoker   . Smokeless tobacco: Not on file  . Alcohol Use: Yes     Comment: 6 wine drinks per week  . Drug Use: No   Social History Narrative   Work or School: homemaker      Home Situation: lives with husband and 61 yo son in 2015      Spiritual Beliefs: Christian      Lifestyle: no regular exercise; diet is fair         Current Outpatient Prescriptions on File Prior to Visit  Medication Sig Dispense Refill  . HYDROcodone-acetaminophen (NORCO/VICODIN) 5-325 MG tablet Take 1-2 tablets by mouth every 4 (four) hours as needed for moderate pain. 20 tablet 0  . Ibuprofen (ADVIL) 200 MG CAPS Take 400 mg by mouth every 6 (six) hours as needed (for pain).     . Lactobacillus (ACIDOPHILUS PO) Take 1 capsule by mouth daily.      No current facility-administered medications on file prior to visit.   Allergies  Allergen Reactions  . Percocet [Oxycodone-Acetaminophen] Nausea Only    Makes pt feel nausea - prefers not to take   Family History  Problem Relation Age of Onset  . Ulcerative colitis Father   . Deep vein thrombosis Father   . Pulmonary embolism Father   . Hypertension Father   . Colon polyps Father   . Hyperlipidemia Mother     and father  . Osteoarthritis Mother   . Gastric cancer Maternal Grandfather   . Stomach cancer Maternal Grandfather   . Colon cancer Neg Hx   . Rectal cancer Neg Hx   . Diabetes Neg Hx    PE: BP 108/64 mmHg  Pulse 97  Temp(Src) 98.1 F (36.7 C) (Oral)  Resp 12  Wt 180 lb (81.647 kg)  SpO2 95%  LMP 06/05/2013 Body mass index is 26.57 kg/(m^2). Wt Readings from Last 3 Encounters:  02/26/15 180 lb (81.647 kg)  01/19/15 173 lb 6 oz (78.642 kg)  01/03/15 175 lb 8 oz (79.606 kg)   Constitutional: overweight, in NAD. No kyphosis. Eyes: PERRLA, EOMI, no exophthalmos ENT:  moist mucous membranes, no thyromegaly, surgical site healing, without swelling, erythema; no cervical lymphadenopathy Cardiovascular: RRR, No MRG Respiratory: CTA B Gastrointestinal: abdomen soft, NT, ND, BS+ Musculoskeletal: no deformities, strength intact in all 4 Skin: moist, warm, no rashes Neurological: no tremor with outstretched hands, DTR normal in all  4  Assessment: 1. Hypercalcemia/hyperparathyroidism  2. Vitamin D deficiency  Plan: Patient has had several instances of elevated calcium, with the highest level being at 10.4. An intact PTH level was also high, at 132 ([facility] assay). She also had vitamin D deficiency, and we increased the level to almost normal before performing further investigation for primary hyperparathyroidism. The labs pointed towards Primary HPTH, and, since she also had history of nephrolithiasis and elevated urinary calcium, patient was referred to surgery. She had right inferior parathyroidectomy on 01/19/2015, with subsequent normalization of calcium and PTH, per records from the surgeon. - She is feeling much better after the surgery - No labs for today - Return in about 1 year (around 02/26/2016).  2. Vitamin D deficiency - Patient was on 2000 units of vitamin D daily before the last vitamin D check. She didn't stop supplementation (?) After the surgery. - I advised her to restart vitamin D a double dose, 4000 units daily - We'll recheck a vitamin D level in 2-3 months  CC: Dr. Louis Meckel - [facility]

## 2015-02-26 NOTE — Patient Instructions (Signed)
Please restart vitamin D 4000 units daily.  Please come back in 2 months for labs.  Please return in 1 year.

## 2015-05-16 ENCOUNTER — Telehealth: Payer: Self-pay | Admitting: Family Medicine

## 2015-05-16 ENCOUNTER — Ambulatory Visit (INDEPENDENT_AMBULATORY_CARE_PROVIDER_SITE_OTHER): Payer: BLUE CROSS/BLUE SHIELD | Admitting: Family Medicine

## 2015-05-16 ENCOUNTER — Encounter: Payer: Self-pay | Admitting: Family Medicine

## 2015-05-16 VITALS — BP 130/72 | HR 89 | Temp 98.3°F | Ht 69.0 in | Wt 176.0 lb

## 2015-05-16 DIAGNOSIS — K625 Hemorrhage of anus and rectum: Secondary | ICD-10-CM

## 2015-05-16 NOTE — Telephone Encounter (Signed)
[facility] - Client [place]  Patient Name: Patricia Ware  DOB: [birth year]    Initial Comment Caller states she has rectal bleeding with bowel movements, has hemorrhoid but no previous bleeding    Nurse Assessment  Nurse: Wayne Sever, RN, Tillie Rung Date/Time (Eastern Time): 05/16/2015 10:16:53 AM  Confirm and document reason for call. If symptomatic, describe symptoms. You must click the next button to save text entered. ---Caller states she is having bright red bleeding with bowel movements. She states it was last night during her BM. She has issues with hemorrhoids per caller.  Has the patient traveled out of the country within the last 30 days? ---Not Applicable  Does the patient have any new or worsening symptoms? ---Yes  Will a triage be completed? ---Yes  Related visit to physician within the last 2 weeks? ---No  Does the PT have any chronic conditions? (i.e. diabetes, asthma, etc.) ---No  Did the patient indicate they were pregnant? ---No  Is this a behavioral health or substance abuse call? ---No     Guidelines    Guideline Title Affirmed Question Affirmed Notes  Rectal Bleeding MODERATE rectal bleeding (small blood clots, passing blood without stool, or toilet water turns red)    Final Disposition User   See Physician within 24 Hours Wayne Sever, RN, [employer]    Referrals  REFERRED TO PCP OFFICE   Disagree/Comply: Leta Baptist

## 2015-05-16 NOTE — Progress Notes (Signed)
   Subjective:    Patient ID: Patricia Ware, female    DOB: 04/18/1966, 50 y.o.   MRN: [id number]  HPI Here to ask about a BM last night which was accompanied by rectal bleeding. The toilet bowl seemed to be filled with blood. The stool then was soft, she did not strain to pass it , and this was not painful for her. No abdominal pain. Usually her stools are soft but she may see a trace of blood on the paper when she wipes at times. She had a colonoscopy last June which was clear except for a few benign polyps.    Review of Systems  Constitutional: Negative.   Gastrointestinal: Positive for blood in stool and anal bleeding. Negative for nausea, vomiting, abdominal pain, diarrhea, constipation, abdominal distention and rectal pain.       Objective:   Physical Exam  Constitutional: She appears well-developed and well-nourished.  Abdominal: Soft. Bowel sounds are normal. She exhibits no distension and no mass. There is no tenderness. There is no rebound and no guarding.  Genitourinary:  Several small external hemorrhoids at the anal verge, one of which has some dried blood on it           Assessment & Plan:  Rectal bleeding, most likely from a hemorrhoid. We discussed getting more fiber in her diet and drinking more water. She can use Preparation H for a few days. Recheck prn

## 2015-05-16 NOTE — Telephone Encounter (Signed)
FYI! Pt coming to see Dr. Sarajane Jews today.

## 2015-05-16 NOTE — [place] Notes (Signed)
Pre visit review using our clinic review tool, if applicable. No additional management support is needed unless otherwise documented below in the visit note. 

## 2015-07-12 ENCOUNTER — Telehealth: Payer: Self-pay | Admitting: Family Medicine

## 2015-07-12 NOTE — Telephone Encounter (Signed)
Pt not seen since 05/2014 for a cpe. Pt would like to get this done before June. No early appointments in order to do fasting labs before then.  Pt states she can come in one morning and see you later that day if you would like. Please advise if ok to schedule, and if pt even needs fasting labs. Thank you

## 2015-07-15 NOTE — Telephone Encounter (Signed)
Can schedule physical in any 30 minute spot.  We will do labs at visit and she does not need to be fasting. Thanks.

## 2015-07-16 NOTE — Telephone Encounter (Signed)
Pt has been scheduled.  °

## 2015-08-13 ENCOUNTER — Encounter: Payer: Self-pay | Admitting: Family Medicine

## 2015-08-13 ENCOUNTER — Ambulatory Visit (INDEPENDENT_AMBULATORY_CARE_PROVIDER_SITE_OTHER): Payer: BLUE CROSS/BLUE SHIELD | Admitting: Family Medicine

## 2015-08-13 VITALS — BP 120/72 | HR 97 | Temp 98.1°F | Ht 67.75 in | Wt 176.2 lb

## 2015-08-13 DIAGNOSIS — R202 Paresthesia of skin: Secondary | ICD-10-CM

## 2015-08-13 DIAGNOSIS — M79671 Pain in right foot: Secondary | ICD-10-CM

## 2015-08-13 DIAGNOSIS — Z0000 Encounter for general adult medical examination without abnormal findings: Secondary | ICD-10-CM | POA: Diagnosis not present

## 2015-08-13 DIAGNOSIS — F411 Generalized anxiety disorder: Secondary | ICD-10-CM | POA: Insufficient documentation

## 2015-08-13 DIAGNOSIS — E210 Primary hyperparathyroidism: Secondary | ICD-10-CM

## 2015-08-13 DIAGNOSIS — N200 Calculus of kidney: Secondary | ICD-10-CM | POA: Diagnosis not present

## 2015-08-13 NOTE — [place] Notes (Signed)
Pre visit review using our clinic review tool, if applicable. No additional management support is needed unless otherwise documented below in the visit note. 

## 2015-08-13 NOTE — Progress Notes (Signed)
HPI:  Here for CPE:  -Concerns and/or follow up today:   Weight gain: -not exercising, diet ok -feels has gained some weight and is unhealthy -some lack of motivation  Plantar fasciitis: -R foot -several months ago -better now with using heal inserts  Paresthesias: -intermittently in both hands - only at night and resolves with shaking hands -no weakness, pain, daytime symptoms  GAD/mild depression: -chronic, on zoloft and lexapro in the past, reports doesn't want to take medications -worries all the time, fatigue, poor motivation -no SI, panic, manic symptoms  Hyperparathyroidism: -hx nephrolithiasis - seeing Dr. Louis Meckel, [facility] -seeing endo (Dr. Cruzita Lederer), s/p R inf parathyroidectomy 12/2014 (Dr. Harlow Asa [facility])  Vit D Def: -on vit D with endocrinologist  Many moles: -sees dermatologist (Dr. Elvera Lennox)  Hx depression: -on zoloft in the past  -Diet: variety of foods, balance and well rounded, larger portion sizes  -Exercise: no regular exercise  -Taking folic acid, vitamin D or calcium: no  -Diabetes and Dyslipidemia Screening: not fasting  -Hx of HTN: no  -Vaccines: UTD  -pap history: does with gyn (Dr. Lynnette Caffey), reports UTD  -sexual activity: yes, female partner, no new partners  -wants STI testing (Hep C if born [birth year]): no  -FH breast, colon or ovarian ca: see FH Last mammogram: does with gyn Last colon cancer screening: 09/2014 with Dr. Olevia Perches  Breast Ca Risk Assessment: -no personal or family hx breast or ovarian cancer  -Alcohol, Tobacco, drug use: see social history  Review of Systems - no fevers, unintentional weight loss, vision loss, hearing loss, chest pain, sob, hemoptysis, melena, hematochezia, hematuria, genital discharge, changing or concerning skin lesions, bleeding, bruising, loc, thoughts of self harm or SI  Past Medical History  Diagnosis Date  . Kidney stone     sees Dr. Louis Meckel, urology  . Colon polyps      saw Dr. Olevia Perches, GI  . Hyperparathyroidism ([place])     s/p R inf parathroidectomy in 2016; seeing Dr Cruzita Lederer, endocrinology  . Hot flashes   . History of chicken pox   . Depression   . Anxiety   . Cold     with low grade fever, surgery cancelled 01-08-15 due to cold  . UTI (lower urinary tract infection)   . Vitamin D deficiency     Past Surgical History  Procedure Laterality Date  . Cesarean section  2003  . Umbilical hernia repair  1977  . Tonsillectomy  1992  . Colonoscopy  10-04-14    per Dr. Olevia Perches, benign polyps, repeat in 10 yrs   . Polypectomy  11-26-2011    sessile serrated polyp  . Parathyroidectomy Right 01/19/2015    Procedure: RIGHT INFERIOR PARATHYROIDECTOMY;  Surgeon: Armandina Gemma, MD;  Location: [facility];  Service: General;  Laterality: Right;    Family History  Problem Relation Age of Onset  . Ulcerative colitis Father   . Deep vein thrombosis Father   . Pulmonary embolism Father   . Hypertension Father   . Colon polyps Father   . Hyperlipidemia Mother     and father  . Osteoarthritis Mother   . Gastric cancer Maternal Grandfather   . Stomach cancer Maternal Grandfather   . Colon cancer Neg Hx   . Rectal cancer Neg Hx   . Diabetes Neg Hx     Social History   Social History  . Marital Status: Married    Spouse Name: N/A  . Number of Children: N/A  . Years of Education: N/A  Social History Main Topics  . Smoking status: Never Smoker   . Smokeless tobacco: Never Used  . Alcohol Use: 0.0 oz/week    0 Standard drinks or equivalent per week     Comment: 1-2 drinks a few nights per week-socially  . Drug Use: No  . Sexual Activity: Not Asked   Other Topics Concern  . None   Social History Narrative   Work or School: homemaker      Home Situation: lives with husband and 12 yo son in 2015      Spiritual Beliefs: Christian      Lifestyle: no regular exercise; diet is fair           Current outpatient prescriptions:  .  Cholecalciferol  (VITAMIN D-3 PO), Take 2,000 Units by mouth daily., Disp: , Rfl:   EXAM:  Filed Vitals:   08/13/15 1307  BP: 120/72  Pulse: 97  Temp: 98.1 F (36.7 C)    GENERAL: vitals reviewed and listed below, alert, oriented, appears well hydrated and in no acute distress  HEENT: head atraumatic, PERRLA, normal appearance of eyes, ears, nose and mouth. moist mucus membranes.  NECK: supple, no masses or lymphadenopathy  LUNGS: clear to auscultation bilaterally, no rales, rhonchi or wheeze  CV: HRRR, no peripheral edema or cyanosis, normal pedal pulses  BREAST:  Declined, does with gyn  ABDOMEN: bowel sounds normal, soft, non tender to palpation, no masses, no rebound or guarding  GU: declined, does with gyn  SKIN: no rash or abnormal lesions, declined full exam - does with dermatologist and scheduled for next week per her report  MS: normal gait, moves all extremities normally, mildly collapse ant arch bilateral feet, no sig TTP or abnormality otherwise on inspection and exam of both feet  NEURO: CN II-XII grossly intact, normal muscle strength and sensation to light touch on extremities, normal strength and sensitivity in both hands, forearms, fingers, neg tinels  PSYCH: normal affect, pleasant and cooperative  ASSESSMENT AND PLAN:  Discussed the following assessment and plan:  Visit for preventive health examination - Plan: Lipid Panel, Hemoglobin A1C, CMP with eGFR -Discussed and advised all [place] preventive services health task force level A and B recommendations for age, sex and risks.  -Advised at least 150 minutes of exercise per week and a healthy diet low in saturated fats and sweets and consisting of fresh fruits and vegetables, lean meats such as fish and white chicken and whole grains.  -labs, studies and vaccines per orders this encounter  Hyperparathyroidism, primary ([place]) -sees specialist, CMP with labs  Nephrolithiasis -sees urologist  Right foot pain -likely  plantar fasciitis per hpi -recs per pt instructions  Paresthesia of both hands - Plan: TSH, Vitamin B12 -? Mild CTS versus sleep position, labs, wrist brace at night and change in sleeping position, advised follow up if persists  GAD (generalized anxiety disorder) -discussed options, she wants to try CBT with apps, maybe with Dr. Glennon Hamilton    Orders Placed This Encounter  Procedures  . Lipid Panel    Standing Status: Future     Number of Occurrences:      Standing Expiration Date: 09/10/2015  . Hemoglobin A1C    Standing Status: Future     Number of Occurrences:      Standing Expiration Date: 09/10/2015  . CMP with eGFR    Standing Status: Future     Number of Occurrences:      Standing Expiration Date: 09/10/2015  . TSH  Standing Status: Future     Number of Occurrences:      Standing Expiration Date: 09/10/2015  . Vitamin B12    Standing Status: Future     Number of Occurrences:      Standing Expiration Date: 09/10/2015    Patient advised to return to clinic immediately if symptoms worsen or persist or new concerns.  Patient Instructions  BEFORE YOU LEAVE: -schedule fasting lab visit in the next 1-2 weeks -follow up in 1-2 months if you are having any remaining foot or hand issues  Can try the follow apps for help with anxiety: icouch, pacifica (has exercise, sleep, eating log), iCBT   [place] sock for the plantar fasciitis.  Firm wrist brace/cock up wrist splint for the tingling in the fingers at night.  We recommend the following healthy lifestyle measures: - eat a healthy whole foods diet consisting of regular small meals composed of vegetables, fruits, beans, nuts, seeds, healthy meats such as white chicken and fish and whole grains.  - avoid sweets, white starchy foods, fried foods, fast food, processed foods, sodas, red meet and other fattening foods.  - get a least 150-300 minutes of aerobic exercise per week.   -We have ordered labs or studies at this visit.  It can take up to 1-2 weeks for results and processing. We will contact you with instructions IF your results are abnormal. Normal results will be released to your [facility]. If you have not heard from [place] or can not find your results in [facility] in 2 weeks please contact our office.              No Follow-up on file.  Colin Benton R.

## 2015-08-13 NOTE — Patient Instructions (Signed)
BEFORE YOU LEAVE: -schedule fasting lab visit in the next 1-2 weeks -follow up in 1-2 months if you are having any remaining foot or hand issues  Can try the follow apps for help with anxiety: icouch, pacifica (has exercise, sleep, eating log), iCBT   [place] sock for the plantar fasciitis.  Firm wrist brace/cock up wrist splint for the tingling in the fingers at night.  We recommend the following healthy lifestyle measures: - eat a healthy whole foods diet consisting of regular small meals composed of vegetables, fruits, beans, nuts, seeds, healthy meats such as white chicken and fish and whole grains.  - avoid sweets, white starchy foods, fried foods, fast food, processed foods, sodas, red meet and other fattening foods.  - get a least 150-300 minutes of aerobic exercise per week.   -We have ordered labs or studies at this visit. It can take up to 1-2 weeks for results and processing. We will contact you with instructions IF your results are abnormal. Normal results will be released to your [employer]. If you have not heard from [place] or can not find your results in [facility] in 2 weeks please contact our office.

## 2015-08-17 ENCOUNTER — Telehealth: Payer: Self-pay | Admitting: *Deleted

## 2015-08-17 ENCOUNTER — Encounter: Payer: Self-pay | Admitting: Family Medicine

## 2015-08-17 NOTE — Telephone Encounter (Signed)
I called the pt and scheduled an appt for 5/2 at 1pm.

## 2015-08-21 ENCOUNTER — Ambulatory Visit (INDEPENDENT_AMBULATORY_CARE_PROVIDER_SITE_OTHER): Payer: BLUE CROSS/BLUE SHIELD | Admitting: Family Medicine

## 2015-08-21 ENCOUNTER — Encounter: Payer: Self-pay | Admitting: Family Medicine

## 2015-08-21 VITALS — BP 130/88 | HR 80 | Temp 98.3°F | Wt 174.0 lb

## 2015-08-21 DIAGNOSIS — N200 Calculus of kidney: Secondary | ICD-10-CM

## 2015-08-21 DIAGNOSIS — R1011 Right upper quadrant pain: Secondary | ICD-10-CM | POA: Diagnosis not present

## 2015-08-21 DIAGNOSIS — Z8742 Personal history of other diseases of the female genital tract: Secondary | ICD-10-CM | POA: Diagnosis not present

## 2015-08-21 LAB — POCT URINALYSIS DIPSTICK
Bilirubin, UA: NEGATIVE
Blood, UA: NEGATIVE
Glucose, UA: NEGATIVE
KETONES UA: NEGATIVE
Leukocytes, UA: NEGATIVE
Nitrite, UA: NEGATIVE
PH UA: 7.5
PROTEIN UA: NEGATIVE
SPEC GRAV UA: 1.01
UROBILINOGEN UA: 0.2

## 2015-08-21 NOTE — Progress Notes (Signed)
HPI:  Patricia Ware is a pleasant 50 year old with a past medical history listed below, here for an acute visit for:  RUQ abd pain: -started:  started 2-3 months ago, but was occurring rarely, now more frequently for the last 1 week -symptoms: "nagging", mild to moderate pain, crampy in nature in the right upper quadrant/epigastric region that occurs for several hours per day; she does not feel that it is related to food or eating, feels bloated occasionally; she sometimes feels like it radiates to her back -denies: Nausea, vomiting, anorexia, fevers, change in bowel habits, constipation, diarrhea, hematochezia, melena, weight loss, dysuria, hematuria, cough, chest pain or shortness of breath -Sees Dr. Louis Ware for history of kidney stones - on review of her records she had a CT stone study 02/2014 and had a likely liver cyst and small right ovarian cyst on that study but no gallbladder disease was mentioned. She sees a gynecologist for her Pap smears and pelvic exams. -We've ordered labs including a CM at her recent physical, but she has not done this blood work yet -agrees to do tomorrow morning; she is going out of town for the rest of the week, and can't do any other visits her testing this week  ROS: See pertinent positives and negatives per HPI.  Past Medical History  Diagnosis Date  . Kidney stone     sees Dr. Louis Ware, urology  . Colon polyps     saw Dr. Olevia Ware, GI  . Hyperparathyroidism ([place])     s/p R inf parathroidectomy in 2016; seeing Dr Patricia Ware, endocrinology  . Hot flashes   . History of chicken pox   . Depression   . Anxiety   . Cold     with low grade fever, surgery cancelled 01-08-15 due to cold  . UTI (lower urinary tract infection)   . Vitamin D deficiency     Past Surgical History  Procedure Laterality Date  . Cesarean section  2003  . Umbilical hernia repair  1977  . Tonsillectomy  1992  . Colonoscopy  10-04-14    per Dr. Olevia Ware, benign polyps, repeat in 10  yrs   . Polypectomy  11-26-2011    sessile serrated polyp  . Parathyroidectomy Right 01/19/2015    Procedure: RIGHT INFERIOR PARATHYROIDECTOMY;  Surgeon: Patricia Gemma, MD;  Location: [facility];  Service: General;  Laterality: Right;    Family History  Problem Relation Age of Onset  . Ulcerative colitis Father   . Deep vein thrombosis Father   . Pulmonary embolism Father   . Hypertension Father   . Colon polyps Father   . Hyperlipidemia Mother     and father  . Osteoarthritis Mother   . Gastric cancer Maternal Grandfather   . Stomach cancer Maternal Grandfather   . Colon cancer Neg Hx   . Rectal cancer Neg Hx   . Diabetes Neg Hx     Social History   Social History  . Marital Status: Married    Spouse Name: N/A  . Number of Children: N/A  . Years of Education: N/A   Social History Main Topics  . Smoking status: Never Smoker   . Smokeless tobacco: Never Used  . Alcohol Use: 0.0 oz/week    0 Standard drinks or equivalent per week     Comment: 1-2 drinks a few nights per week-socially  . Drug Use: No  . Sexual Activity: Not Asked   Other Topics Concern  . None   Social History Narrative  Work or School: homemaker      Home Situation: lives with husband and 45 yo son in 2015      Spiritual Beliefs: Christian      Lifestyle: no regular exercise; diet is fair           Current outpatient prescriptions:  .  Cholecalciferol (VITAMIN D-3 PO), Take 2,000 Units by mouth daily., Disp: , Rfl:   EXAM:  Filed Vitals:   08/21/15 1302  BP: 130/88  Pulse: 80  Temp: 98.3 F (36.8 C)    Body mass index is 26.65 kg/(m^2).  GENERAL: vitals reviewed and listed above, alert, oriented, appears well hydrated and in no acute distress  HEENT: atraumatic, conjunttiva clear, no obvious abnormalities on inspection of external nose and ears  NECK: no obvious masses on inspection  LUNGS: clear to auscultation bilaterally, no wheezes, rales or rhonchi, good air movement  CV:  HRRR, no peripheral edema  ABD: BS+, soft, NTTP, no rebound or gurading  MS: moves all extremities without noticeable abnormality  PSYCH: pleasant and cooperative, no obvious depression or anxiety  ASSESSMENT AND PLAN:  Discussed the following assessment and plan:  RUQ pain - Plan: US Abdomen Limited RUQ, Lipase, H. pylori antigen, stool, CBC with Differential -we discussed possible serious and likely etiologies, workup and treatment, treatment risks and return precautions -after this discussion, [name] opted for labs tomorrow - added udip (clear)cbc, lipase, stool h pylori antigen; RUQ [place]; also advised she let her gyn know about her hx of ovarian cyst - her symptoms are not in this area, but advised follow up with gyn, particularly if symptoms persist. -follow up advised pending result. -of course, we advised [name]  to return or notify a doctor immediately if symptoms worsen or persist or new concerns arise.  Hx of ovarian cyst -advised she notify gyn of this hx and follow up with gyn if persistent bloating - current symptoms likely unrelated. She reports she has gyn follow up and will mention when she sees them.  Nephrolithiasis -no blood in urine, doubt moving stone, but has hx of stones  -Patient advised to return or notify a doctor immediately if symptoms worsen or persist or new concerns arise.  Patient Instructions  Please get your lab work done tomorrow as planned.  We placed a referral for you as discussed for the ultrasound. It usually takes about 1 week to process and schedule this referral. If you have not heard from [place] regarding this appointment in 1 week please contact our office.  Please notify her gynecologist of your history of an ovarian cyst.      Patricia Ware R.

## 2015-08-21 NOTE — [place] Notes (Signed)
Pre visit review using our clinic review tool, if applicable. No additional management support is needed unless otherwise documented below in the visit note. 

## 2015-08-21 NOTE — Patient Instructions (Signed)
Please get your lab work done tomorrow as planned.  We placed a referral for you as discussed for the ultrasound. It usually takes about 1 week to process and schedule this referral. If you have not heard from [place] regarding this appointment in 1 week please contact our office.  Please notify her gynecologist of your history of an ovarian cyst.

## 2015-08-22 ENCOUNTER — Other Ambulatory Visit (INDEPENDENT_AMBULATORY_CARE_PROVIDER_SITE_OTHER): Payer: BLUE CROSS/BLUE SHIELD

## 2015-08-22 DIAGNOSIS — R202 Paresthesia of skin: Secondary | ICD-10-CM | POA: Diagnosis not present

## 2015-08-22 DIAGNOSIS — R1011 Right upper quadrant pain: Secondary | ICD-10-CM

## 2015-08-22 DIAGNOSIS — Z0000 Encounter for general adult medical examination without abnormal findings: Secondary | ICD-10-CM

## 2015-08-22 LAB — CBC WITH DIFFERENTIAL/PLATELET
BASOS ABS: 0 10*3/uL (ref 0.0–0.1)
Basophils Relative: 0.5 % (ref 0.0–3.0)
EOS ABS: 0.1 10*3/uL (ref 0.0–0.7)
Eosinophils Relative: 2.3 % (ref 0.0–5.0)
HEMATOCRIT: 38.3 % (ref 36.0–46.0)
HEMOGLOBIN: 13 g/dL (ref 12.0–15.0)
LYMPHS PCT: 35.5 % (ref 12.0–46.0)
Lymphs Abs: 1.4 10*3/uL (ref 0.7–4.0)
MCHC: 33.9 g/dL (ref 30.0–36.0)
MCV: 87.6 fl (ref 78.0–100.0)
MONO ABS: 0.3 10*3/uL (ref 0.1–1.0)
Monocytes Relative: 7.9 % (ref 3.0–12.0)
NEUTROS PCT: 53.8 % (ref 43.0–77.0)
Neutro Abs: 2.2 10*3/uL (ref 1.4–7.7)
PLATELETS: 197 10*3/uL (ref 150.0–400.0)
RBC: 4.37 Mil/uL (ref 3.87–5.11)
RDW: 12.7 % (ref 11.5–15.5)
WBC: 4.1 10*3/uL (ref 4.0–10.5)

## 2015-08-22 LAB — LIPID PANEL
CHOLESTEROL: 153 mg/dL (ref 0–200)
HDL: 50.3 mg/dL (ref >39.00)
LDL Cholesterol: 93 mg/dL (ref 0–99)
NonHDL: 103.09
TRIGLYCERIDES: 50 mg/dL (ref 0.0–149.0)
Total CHOL/HDL Ratio: 3
VLDL: 10 mg/dL (ref 0.0–40.0)

## 2015-08-22 LAB — LIPASE: Lipase: 25 U/L (ref 11.0–59.0)

## 2015-08-22 LAB — HEMOGLOBIN A1C: Hgb A1c MFr Bld: 5.6 % (ref 4.6–6.5)

## 2015-08-22 LAB — TSH: TSH: 1.67 u[IU]/mL (ref 0.35–4.50)

## 2015-08-22 LAB — VITAMIN B12: VITAMIN B 12: 184 pg/mL — AB (ref 211–911)

## 2015-08-23 LAB — COMPLETE METABOLIC PANEL WITH GFR
ALT: 12 U/L (ref 6–29)
AST: 14 U/L (ref 10–35)
Albumin: 4.4 g/dL (ref 3.6–5.1)
Alkaline Phosphatase: 78 U/L (ref 33–130)
BILIRUBIN TOTAL: 0.6 mg/dL (ref 0.2–1.2)
BUN: 15 mg/dL (ref 7–25)
CHLORIDE: 106 mmol/L (ref 98–110)
CO2: 27 mmol/L (ref 20–31)
CREATININE: 0.73 mg/dL (ref 0.50–1.05)
Calcium: 8.9 mg/dL (ref 8.6–10.4)
GFR, Est African American: >89 mL/min (ref >=60)
GLUCOSE: 92 mg/dL (ref 65–99)
Potassium: 4.1 mmol/L (ref 3.5–5.3)
SODIUM: 139 mmol/L (ref 135–146)
TOTAL PROTEIN: 6.8 g/dL (ref 6.1–8.1)

## 2015-08-29 ENCOUNTER — Ambulatory Visit
Admission: RE | Admit: 2015-08-29 | Discharge: 2015-08-29 | Disposition: A | Discharge status: Home / Self Care | Payer: BLUE CROSS/BLUE SHIELD | Source: Ambulatory Visit | Attending: Family Medicine | Admitting: Family Medicine

## 2015-08-29 DIAGNOSIS — R1011 Right upper quadrant pain: Secondary | ICD-10-CM

## 2015-08-30 ENCOUNTER — Other Ambulatory Visit: Payer: Self-pay | Admitting: *Deleted

## 2015-08-30 DIAGNOSIS — D1800 Hemangioma unspecified site: Secondary | ICD-10-CM

## 2015-08-31 ENCOUNTER — Telehealth: Payer: Self-pay | Admitting: *Deleted

## 2015-08-31 NOTE — Telephone Encounter (Signed)
I called the pt and advised her per Dr Maudie Mercury the [facility] test was negative.

## 2015-09-06 ENCOUNTER — Ambulatory Visit
Admission: RE | Admit: 2015-09-06 | Discharge: 2015-09-06 | Disposition: A | Discharge status: Home / Self Care | Payer: BLUE CROSS/BLUE SHIELD | Source: Ambulatory Visit | Attending: Family Medicine | Admitting: Family Medicine

## 2015-09-06 DIAGNOSIS — D1800 Hemangioma unspecified site: Secondary | ICD-10-CM

## 2015-09-06 MED ORDER — GADOXETATE DISODIUM 0.25 MMOL/ML IV SOLN: 8.0000 mL | Freq: Once | INTRAVENOUS | Status: DC | PRN

## 2015-09-11 ENCOUNTER — Encounter: Payer: Self-pay | Admitting: Family Medicine

## 2015-09-11 ENCOUNTER — Ambulatory Visit (INDEPENDENT_AMBULATORY_CARE_PROVIDER_SITE_OTHER): Payer: BLUE CROSS/BLUE SHIELD | Admitting: Family Medicine

## 2015-09-11 VITALS — BP 120/82 | HR 91 | Temp 98.5°F | Ht 67.75 in | Wt 173.0 lb

## 2015-09-11 DIAGNOSIS — D1803 Hemangioma of intra-abdominal structures: Secondary | ICD-10-CM

## 2015-09-11 DIAGNOSIS — R1011 Right upper quadrant pain: Secondary | ICD-10-CM | POA: Diagnosis not present

## 2015-09-11 DIAGNOSIS — E538 Deficiency of other specified B group vitamins: Secondary | ICD-10-CM | POA: Insufficient documentation

## 2015-09-11 DIAGNOSIS — R7989 Other specified abnormal findings of blood chemistry: Secondary | ICD-10-CM

## 2015-09-11 DIAGNOSIS — R140 Abdominal distension (gaseous): Secondary | ICD-10-CM | POA: Diagnosis not present

## 2015-09-11 NOTE — [place] Notes (Signed)
Pre visit review using our clinic review tool, if applicable. No additional management support is needed unless otherwise documented below in the visit note. 

## 2015-09-11 NOTE — Patient Instructions (Signed)
BEFORE YOU LEAVE: -follow up: 3-4 months -labs for celiac testing  -We placed a referral for you as discussed to the gastroenterologist. It usually takes about 1-2 weeks to process and schedule this referral. If you have not heard from [place] regarding this appointment in 2 weeks please contact our office.

## 2015-09-11 NOTE — Progress Notes (Signed)
HPI:  RUQ abd pain: -started: started 3-4 months ago, intermittent, a little better since last visit -symptoms: "nagging", mild to moderate pain, crampy in nature in the right upper quadrant/epigastric region that occurs for several hours per day when occurs; she does not feel that it is related to food or eating, feels bloated occasionally; she sometimes feels like it radiates to her back -denies: Nausea, vomiting, anorexia, fevers, change in bowel habits, constipation, diarrhea, hematochezia, melena, weight loss, dysuria, hematuria, cough, chest pain or shortness of breath -she does have chronically loose bowels or constipation intermittently -Sees Dr. Louis Meckel for history of kidney stones - on review of her records she had a CT stone study 02/2014 and had a likely liver cyst and small right ovarian cyst on that study. She sees a gynecologist for her Pap smears and pelvic exams yearly and has visit this summer. -[place] RUQ showed liver lesion and MRI advised, this showed small 2 cm stable hemangioma -labs including: CBC, CMP, h. Pylori, lipase, tsh, lipids, hgba1c and urine all ok -taking b12 for low vit b12  ROS: See pertinent positives and negatives per HPI.  Past Medical History  Diagnosis Date  . Kidney stone     sees Dr. Louis Meckel, urology  . Colon polyps     saw Dr. Olevia Perches, GI  . Hyperparathyroidism ([place])     s/p R inf parathroidectomy in 2016; seeing Dr Cruzita Lederer, endocrinology  . Hot flashes   . History of chicken pox   . Depression   . Anxiety   . Cold     with low grade fever, surgery cancelled 01-08-15 due to cold  . UTI (lower urinary tract infection)   . Vitamin D deficiency     Past Surgical History  Procedure Laterality Date  . Cesarean section  2003  . Umbilical hernia repair  1977  . Tonsillectomy  1992  . Colonoscopy  10-04-14    per Dr. Olevia Perches, benign polyps, repeat in 10 yrs   . Polypectomy  11-26-2011    sessile serrated polyp  . Parathyroidectomy Right 01/19/2015     Procedure: RIGHT INFERIOR PARATHYROIDECTOMY;  Surgeon: Armandina Gemma, MD;  Location: [facility];  Service: General;  Laterality: Right;    Family History  Problem Relation Age of Onset  . Ulcerative colitis Father   . Deep vein thrombosis Father   . Pulmonary embolism Father   . Hypertension Father   . Colon polyps Father   . Hyperlipidemia Mother     and father  . Osteoarthritis Mother   . Gastric cancer Maternal Grandfather   . Stomach cancer Maternal Grandfather   . Colon cancer Neg Hx   . Rectal cancer Neg Hx   . Diabetes Neg Hx     Social History   Social History  . Marital Status: Married    Spouse Name: N/A  . Number of Children: N/A  . Years of Education: N/A   Social History Main Topics  . Smoking status: Never Smoker   . Smokeless tobacco: Never Used  . Alcohol Use: 0.0 oz/week    0 Standard drinks or equivalent per week     Comment: 1-2 drinks a few nights per week-socially  . Drug Use: No  . Sexual Activity: Not Asked   Other Topics Concern  . None   Social History Narrative   Work or School: homemaker      Home Situation: lives with husband and 80 yo son in 2015      Spiritual  Beliefs: Christian      Lifestyle: no regular exercise; diet is fair           Current outpatient prescriptions:  .  Cholecalciferol (VITAMIN D-3 PO), Take 2,000 Units by mouth daily., Disp: , Rfl:  .  Cyanocobalamin (VITAMIN B-12) 1000 MCG SUBL, Place under the tongue daily., Disp: , Rfl:   EXAM:  Filed Vitals:   09/11/15 1552  BP: 120/82  Pulse: 91  Temp: 98.5 F (36.9 C)    Body mass index is 26.49 kg/(m^2).  GENERAL: vitals reviewed and listed above, alert, oriented, appears well hydrated and in no acute distress  HEENT: atraumatic, conjunttiva clear, no obvious abnormalities on inspection of external nose and ears  NECK: no obvious masses on inspection  LUNGS: clear to auscultation bilaterally, no wheezes, rales or rhonchi, good air movement  CV: HRRR,  no peripheral edema  MS: moves all extremities without noticeable abnormality  PSYCH: pleasant and cooperative, no obvious depression or anxiety  ASSESSMENT AND PLAN: > 25 minutes spent with patient with > 50% face to face in counseling. Discussed the following assessment and plan:  RUQ abdominal pain - Plan: Ambulatory referral to Gastroenterology, Celiac Ab tTG DGP TIgA  Bloating - Plan: Ambulatory referral to Gastroenterology, Celiac Ab tTG DGP TIgA  Low serum vitamin B12  Liver hemangioma - Plan: Ambulatory referral to Gastroenterology  -she is anxious about her symptoms and the hemangioma - the hemangioma is stable and I feel is unlikely to be the cause of her symptoms -testing so far with RUQ [place], MRI, urine and labs normal otherwise -will check TTG and will have her see gi  -also advised follow up with her gynecologist -Patient advised to return or notify a doctor immediately if symptoms worsen or persist or new concerns arise.  Patient Instructions  BEFORE YOU LEAVE: -follow up: 3-4 months -labs for celiac testing  -We placed a referral for you as discussed to the gastroenterologist. It usually takes about 1-2 weeks to process and schedule this referral. If you have not heard from [place] regarding this appointment in 2 weeks please contact our office.      Colin Benton R.

## 2015-09-21 ENCOUNTER — Ambulatory Visit (INDEPENDENT_AMBULATORY_CARE_PROVIDER_SITE_OTHER): Payer: BLUE CROSS/BLUE SHIELD | Admitting: Gastroenterology

## 2015-09-21 ENCOUNTER — Encounter: Payer: Self-pay | Admitting: Gastroenterology

## 2015-09-21 VITALS — BP 112/60 | HR 88 | Ht 68.0 in | Wt 175.0 lb

## 2015-09-21 DIAGNOSIS — D1803 Hemangioma of intra-abdominal structures: Secondary | ICD-10-CM

## 2015-09-21 DIAGNOSIS — R1310 Dysphagia, unspecified: Secondary | ICD-10-CM | POA: Diagnosis not present

## 2015-09-21 DIAGNOSIS — Z86010 Personal history of colonic polyps: Secondary | ICD-10-CM

## 2015-09-21 DIAGNOSIS — R1010 Upper abdominal pain, unspecified: Secondary | ICD-10-CM

## 2015-09-21 NOTE — Progress Notes (Signed)
HPI :  50 y/o female here for evaluation for abdominal pain. She has been followed by Dr. Olevia Perches previously, she is new to me.   Patient has had some nagging intermittent pain in her upper abdomen. It has been ongoing for several months. Pain is in the RUQ to epihgastric area and radiates into her back at times. Sometimes she feels it for several days and sometimes does not feel it at all for a while. She can think of anything that triggers for her. Eating does not make the pain better or worse, she does not think eating is related at all. She denies any positional changes. When the pain occurs it will last for minutes at a time, but sometimes can last all day long. The pain is dull and nagging. Some days she will feel this and some days she will not. Pain has been stable over time, not worsening. She is eating okay. No nausea or vomiting. No early satiety. Pain has woken her from sleep in the past from this once. No changes in her bowel habits, they fluctuate depending on what she eats. Stool frequency is variable. No relation of bowel habits with her pain. No blood in the stools. She takes periodic NSAIDs, not using regularly. She has not taken NSAIDs in a while. She reports a history of Shingles in the area of her pain remotely.   Thus far she has had an [place] which showed no gallstones but possible hemangioma. Follow up MRI abdomen showed benign hepatic hemangioma 2 x 2 cm, stable since 2015, normal pancreas and biliary tree, no pathology noted to cause pain.   She has occasional heartburn, perhaps a few times per month, not significant. She has some occasional regurgitation. On ROS she does endorse some occasional dysphagia, with food get caught up in her mid chest. Sometimes she needs to stop eating and drink fluids to push it down. She thinks this has been ongoing for a long time, symptoms periodic, no prior upper endoscopy.   No FH of colon cancer. Father had UC. Grandfather had gastric cancer. No  pancreatic cancer.   Colonoscopy history: Colonoscopy 8/13 - one serrated adenoma Colonoscopy 6/16 - benign sigmoid polyp  Past Medical History  Diagnosis Date  . Kidney stone     sees Dr. Louis Meckel, urology  . Adenomatous colon polyp 2013  . Hyperparathyroidism ([place])     s/p R inf parathroidectomy in 2016; seeing Dr Cruzita Lederer, endocrinology  . Hot flashes   . History of chicken pox   . Depression   . Anxiety   . Cold     with low grade fever, surgery cancelled 01-08-15 due to cold  . UTI (lower urinary tract infection)   . Vitamin D deficiency      Past Surgical History  Procedure Laterality Date  . Cesarean section  2003  . Umbilical hernia repair  1977  . Tonsillectomy  1992  . Colonoscopy  10-04-14    per Dr. Olevia Perches, benign polyps, repeat in 10 yrs   . Polypectomy  11-26-2011    sessile serrated polyp  . Parathyroidectomy Right 01/19/2015    Procedure: RIGHT INFERIOR PARATHYROIDECTOMY;  Surgeon: Armandina Gemma, MD;  Location: [facility];  Service: General;  Laterality: Right;   Family History  Problem Relation Age of Onset  . Ulcerative colitis Father   . Deep vein thrombosis Father   . Pulmonary embolism Father   . Hypertension Father   . Colon polyps Father   . Hyperlipidemia  Mother     and father  . Osteoarthritis Mother   . Gastric cancer Maternal Grandfather   . Stomach cancer Maternal Grandfather   . Colon cancer Neg Hx   . Rectal cancer Neg Hx   . Diabetes Neg Hx    Social History  Substance Use Topics  . Smoking status: Never Smoker   . Smokeless tobacco: Never Used  . Alcohol Use: 0.0 oz/week    0 Standard drinks or equivalent per week     Comment: 1-2 drinks a few nights per week-socially   Current Outpatient Prescriptions  Medication Sig Dispense Refill  . Cholecalciferol (VITAMIN D-3 PO) Take 2,000 Units by mouth daily.    . Cyanocobalamin (VITAMIN B-12) 1000 MCG SUBL Place under the tongue daily.     No current facility-administered medications for  this visit.   Allergies  Allergen Reactions  . Percocet [Oxycodone-Acetaminophen] Nausea Only    Makes pt feel nausea - prefers not to take     Review of Systems: All systems reviewed and negative except where noted in HPI.   Lab Results  Component Value Date   WBC 4.1 08/22/2015   HGB 13.0 08/22/2015   HCT 38.3 08/22/2015   MCV 87.6 08/22/2015   PLT 197.0 08/22/2015    Lab Results  Component Value Date   ALT 12 08/22/2015   AST 14 08/22/2015   ALKPHOS 78 08/22/2015   BILITOT 0.6 08/22/2015    Lab Results  Component Value Date   CREATININE 0.73 08/22/2015   BUN 15 08/22/2015   NA 139 08/22/2015   K 4.1 08/22/2015   CL 106 08/22/2015   CO2 27 08/22/2015   H pylori stool AG negative  Mr Abdomen W Wo Contrast  09/06/2015  CLINICAL DATA:  Left hepatic lobe mass seen on recent ultrasound. Right upper quadrant pain for several months. EXAM: MRI ABDOMEN WITHOUT AND WITH CONTRAST TECHNIQUE: Multiplanar multisequence MR imaging of the abdomen was performed both before and after the administration of intravenous contrast. CONTRAST:  8 mL Eovist COMPARISON:  Abdomen ultrasound on 08/29/2015 and noncontrast CT on 03/12/2014 FINDINGS: Lower chest:  No acute findings. Hepatobiliary: A 2.2 x 2.1 cm lesion is seen in segment 2 of the left lobe which shows near fluid T2 hyperintensity and mild peripheral nodular contrast enhancement. This corresponds to lesions seen on recent ultrasound and is also stable since previous CT in 2015, consistent with benign hemangioma. No other liver masses are identified. Probable cyst tiny sub-cm cyst seen in the anterior liver dome. Gallbladder is unremarkable. No evidence of biliary ductal dilatation. Pancreas: No mass, inflammatory changes, or other parenchymal abnormality identified. Spleen:  Within normal limits in size and appearance. Adrenals/Urinary Tract: No masses identified. No evidence of hydronephrosis. Tiny sub-cm left renal cysts noted.  Stomach/Bowel: Visualized portions within the abdomen are unremarkable. Vascular/Lymphatic: No pathologically enlarged lymph nodes identified. No abdominal aortic aneurysm demonstrated. Other:  None. Musculoskeletal:  No suspicious bone lesions identified. IMPRESSION: 2 cm benign hemangioma in the left hepatic lobe, corresponding to lesion seen on recent ultrasound. No other significant abnormality identified. Electronically Signed   By: Earle Gell M.D.   On: 09/06/2015 14:21   US Abdomen Limited Ruq  08/29/2015  CLINICAL DATA:  Right upper quadrant pain for the past 6 months; history of left lobe hepatic cysts. EXAM: US ABDOMEN LIMITED - RIGHT UPPER QUADRANT COMPARISON:  Abdominal and pelvic CT scan of March 12, 2014. FINDINGS: Gallbladder: No gallstones or wall thickening visualized. No  sonographic Percell Miller sign noted by sonographer. Common bile duct: Diameter: 1.7 mm Liver: The hepatic echotexture is normal. In the left hepatic lobe there is a hyperechoic focus with central decreased echogenicity which likely reflects a hemangioma. This is in the site of the previous study. It measures 2.5 x 2.6 x 2.3 cm and has increased in size. There is no intrahepatic ductal dilation. The surface contour of the liver is normal. IMPRESSION: 1. Probable hemangioma within the left hepatic lobe which has increased in size since the November 2015 abdominal CT scan. Hepatic protocol MRI is recommended to further assess this lesion. 2. Normal appearance of the gallbladder and common bile duct. Electronically Signed   By: David  Martinique M.D.   On: 08/29/2015 10:07    Physical Exam: BP 112/60 mmHg  Pulse 88  Ht 5\' 8"  (1.727 m)  Wt 175 lb (79.379 kg)  BMI 26.61 kg/m2  LMP 06/05/2013 Constitutional: Pleasant,well-developed, female in no acute distress. HEENT: Normocephalic and atraumatic. Conjunctivae are normal. No scleral icterus. Neck supple.  Cardiovascular: Normal rate, regular rhythm.  Pulmonary/chest: Effort  normal and breath sounds normal. No wheezing, rales or rhonchi. Abdominal: Soft, nondistended, nontender. Negative [place] - could not produce symptoms wit palpation. Bowel sounds active throughout. There are no masses palpable. No hepatomegaly. Extremities: no edema Lymphadenopathy: No cervical adenopathy noted. Neurological: Alert and oriented to person place and time. Skin: Skin is warm and dry. No rashes noted. Psychiatric: Normal mood and affect. Behavior is normal.   ASSESSMENT AND PLAN: 50 y/o female with several months of ongoing intermittent upper abdominal pain in the RUQ to epigastric area without any clear triggers. Labs normal. H pylori testing negative. US shows no gallstones. MRI abdomen shows benign hepatic angioma, 2 x 2 cm stable since 2015, otherwise normal pancreas and exam otherwise.   Overall, unclear source of her pain. It is not biliary colic and think very unlikely related to her gallbladder. Her pancreas appears normal. PUD remains on the differential, although H pylori negative and she does not take NSAIDs. On ROS she otherwise endorses ongoing solid food dysphagia. Recommend EGD to evaluate her dysphagia and her upper abdominal pain to ensure normal. I discussed the risks / benefits of EGD and she wanted to proceed. She has had celiac testing per primary care and will obtain this result. Her pain could otherwise be musculoskeletal but I can't elicit it on exam. Her hepatic hemangioma is small and I doubt related to these symptoms, although possible, I would expect for this to cause symptoms it would be much larger. Given it's small size and stability over 2 years, no further surveillance imaging is recommended for this lesion. We will await her EGD results with further recommendations.   Otherwise, of note, she had a serrated adenoma removed in 2013 which led to a surveillance exam in 2016, which was normal. Per guidelines, her next surveillance exam should be done in 5  years from her last exam, so due in 2021. If that exam is normal, will then lengthen her screening interval to 10 years. I have changed her recall in the system. She agreed.   Gunnison Cellar, MD [facility] Pager [phone]  CC: Lucretia Kern, DO

## 2015-09-21 NOTE — Patient Instructions (Signed)

## 2015-09-24 ENCOUNTER — Encounter: Payer: Self-pay | Admitting: Gastroenterology

## 2015-09-27 ENCOUNTER — Encounter: Payer: Self-pay | Admitting: Gastroenterology

## 2015-09-27 ENCOUNTER — Ambulatory Visit (AMBULATORY_SURGERY_CENTER): Payer: BLUE CROSS/BLUE SHIELD | Admitting: Gastroenterology

## 2015-09-27 VITALS — BP 126/77 | HR 78 | Temp 99.1°F | Resp 13 | Ht 68.0 in | Wt 175.0 lb

## 2015-09-27 DIAGNOSIS — G8929 Other chronic pain: Secondary | ICD-10-CM | POA: Diagnosis not present

## 2015-09-27 DIAGNOSIS — R1010 Upper abdominal pain, unspecified: Secondary | ICD-10-CM

## 2015-09-27 DIAGNOSIS — R1011 Right upper quadrant pain: Principal | ICD-10-CM

## 2015-09-27 MED ORDER — SODIUM CHLORIDE 0.9 % IV SOLN: 500.0000 mL | INTRAVENOUS | Status: DC

## 2015-09-27 NOTE — Op Note (Signed)
Export Patient Name: Patricia Ware Procedure Date: 09/27/2015 9:47 AM MRN: [id number] Endoscopist: Remo Lipps P. Havery Moros , MD Age: 50 Referring MD:  Date of Birth: 01-05-1966 Gender: Female Procedure:                Upper GI endoscopy Indications:              Upper abdominal pain, Dysphagia Medicines:                Monitored Anesthesia Care Procedure:                Pre-Anesthesia Assessment:                           - Prior to the procedure, a History and Physical                            was performed, and patient medications and                            allergies were reviewed. The patient's tolerance of                            previous anesthesia was also reviewed. The risks                            and benefits of the procedure and the sedation                            options and risks were discussed with the patient.                            All questions were answered, and informed consent                            was obtained. Prior Anticoagulants: The patient has                            taken no previous anticoagulant or antiplatelet                            agents. ASA Grade Assessment: II - A patient with                            mild systemic disease. After reviewing the risks                            and benefits, the patient was deemed in                            satisfactory condition to undergo the procedure.                           After obtaining informed consent, the endoscope was  passed under direct vision. Throughout the                            procedure, the patient's blood pressure, pulse, and                            oxygen saturations were monitored continuously. The                            Model GIF-HQ190 [id number]) scope was introduced                            through the mouth, and advanced to the second part                            of duodenum. The upper GI endoscopy  was                            accomplished without difficulty. The patient                            tolerated the procedure well. Scope In: Scope Out: Findings:                 Esophagogastric landmarks were identified: the                            Z-line was found at 40 cm, the gastroesophageal                            junction was found at 40 cm and the upper extent of                            the gastric folds was found at 40 cm from the                            incisors.                           The exam of the esophagus was otherwise normal.                           Biopsies were obtained with cold forceps for                            evaluation of eosinophilic esophagitis in the upper                            third of the esophagus, in the middle third of the                            esophagus and in the lower third of the esophagus.  The entire examined stomach was normal. Biopsies                            were taken with a cold forceps for Helicobacter                            pylori testing.                           The duodenal bulb and second portion of the                            duodenum were normal. Biopsies for histology were                            taken with a cold forceps for evaluation of celiac                            disease. Complications:            No immediate complications. Estimated blood loss:                            Minimal. Estimated Blood Loss:     Estimated blood loss was minimal. Impression:               - Esophagogastric landmarks identified.                           - Upper esophagus normal, biopsies taken rto rule                            out EoE.                           - Normal stomach. Biopsied.                           - Normal duodenal bulb and second portion of the                            duodenum. Biopsied.                           - Biopsies were obtained in the upper  third of the                            esophagus, in the middle third of the esophagus and                            in the lower third of the esophagus. Recommendation:           - Patient has a contact number available for                            emergencies. The signs and symptoms of potential  delayed complications were discussed with the                            patient. Return to normal activities tomorrow.                            Written discharge instructions were provided to the                            patient.                           - Resume previous diet.                           - Continue present medications.                           - Await pathology results. Remo Lipps P. Jenie Parish, MD 09/27/2015 10:06:45 AM This report has been signed electronically.

## 2015-09-27 NOTE — Progress Notes (Signed)
Called to room to assist during endoscopic procedure.  Patient ID and intended procedure confirmed with present staff. Received instructions for my participation in the procedure from the performing physician.  

## 2015-09-27 NOTE — Patient Instructions (Signed)
Impression/recommendations:  Normal exam. Biopsies obtained.  YOU HAD AN ENDOSCOPIC PROCEDURE TODAY AT [facility]:   Refer to the procedure report that was given to you for any specific questions about what was found during the examination.  If the procedure report does not answer your questions, please call your gastroenterologist to clarify.  If you requested that your care partner not be given the details of your procedure findings, then the procedure report has been included in a sealed envelope for you to review at your convenience later.  YOU SHOULD EXPECT: Some feelings of bloating in the abdomen. Passage of more gas than usual.  Walking can help get rid of the air that was put into your GI tract during the procedure and reduce the bloating. If you had a lower endoscopy (such as a colonoscopy or flexible sigmoidoscopy) you may notice spotting of blood in your stool or on the toilet paper. If you underwent a bowel prep for your procedure, you may not have a normal bowel movement for a few days.  Please Note:  You might notice some irritation and congestion in your nose or some drainage.  This is from the oxygen used during your procedure.  There is no need for concern and it should clear up in a day or so.  SYMPTOMS TO REPORT IMMEDIATELY:   Following upper endoscopy (EGD)  Vomiting of blood or coffee ground material  New chest pain or pain under the shoulder blades  Painful or persistently difficult swallowing  New shortness of breath  Fever of 100F or higher  Black, tarry-looking stools  For urgent or emergent issues, a gastroenterologist can be reached at any hour by calling [phone].   DIET: Your first meal following the procedure should be a small meal and then it is ok to progress to your normal diet. Heavy or fried foods are harder to digest and may make you feel nauseous or bloated.  Likewise, meals heavy in dairy and vegetables can increase bloating.   Drink plenty of fluids but you should avoid alcoholic beverages for 24 hours.  ACTIVITY:  You should plan to take it easy for the rest of today and you should NOT DRIVE or use heavy machinery until tomorrow (because of the sedation medicines used during the test).    FOLLOW UP: Our staff will call the number listed on your records the next business day following your procedure to check on you and address any questions or concerns that you may have regarding the information given to you following your procedure. If we do not reach you, we will leave a message.  However, if you are feeling well and you are not experiencing any problems, there is no need to return our call.  We will assume that you have returned to your regular daily activities without incident.  If any biopsies were taken you will be contacted by phone or by letter within the next 1-3 weeks.  Please call us at [phone] if you have not heard about the biopsies in 3 weeks.    SIGNATURES/CONFIDENTIALITY: You and/or your care partner have signed paperwork which will be entered into your electronic medical record.  These signatures attest to the fact that that the information above on your After Visit Summary has been reviewed and is understood.  Full responsibility of the confidentiality of this discharge information lies with you and/or your care-partner.

## 2015-09-27 NOTE — Progress Notes (Signed)
To PACU Pt awake and alert. Report to RN 

## 2015-09-28 ENCOUNTER — Telehealth: Payer: Self-pay | Admitting: *Deleted

## 2015-09-28 NOTE — Telephone Encounter (Signed)
  Follow up Call-  Call back number 09/27/2015 10/04/2014  Post procedure Call Back phone  # [phone] cell [phone]  Permission to leave phone message Yes Yes     Patient questions:  Do you have a fever, pain , or abdominal swelling? No. Pain Score  0 *  Have you tolerated food without any problems? Yes.    Have you been able to return to your normal activities? Yes.    Do you have any questions about your discharge instructions: Diet   No. Medications  No. Follow up visit  No.  Do you have questions or concerns about your Care? No.  Actions: * If pain score is 4 or above: No action needed, pain <4.

## 2015-10-24 ENCOUNTER — Encounter: Payer: Self-pay | Admitting: Family Medicine

## 2015-10-25 ENCOUNTER — Other Ambulatory Visit: Payer: Self-pay | Admitting: *Deleted

## 2015-10-25 MED ORDER — FLUCONAZOLE 150 MG PO TABS: 150.0000 mg | ORAL_TABLET | ORAL | Status: DC | PRN

## 2015-10-25 NOTE — Telephone Encounter (Signed)
Rx done and the pt was informed via Mychart message. 

## 2015-10-25 NOTE — Telephone Encounter (Signed)
Call in Diflucan 150 mg to take prn, #1 with 2 rf

## 2015-11-14 ENCOUNTER — Ambulatory Visit: Payer: BLUE CROSS/BLUE SHIELD | Admitting: Gastroenterology

## 2015-11-28 ENCOUNTER — Encounter: Payer: Self-pay | Admitting: Gastroenterology

## 2015-11-28 ENCOUNTER — Ambulatory Visit (INDEPENDENT_AMBULATORY_CARE_PROVIDER_SITE_OTHER): Payer: BLUE CROSS/BLUE SHIELD | Admitting: Gastroenterology

## 2015-11-28 VITALS — BP 122/70 | HR 74 | Ht 68.0 in | Wt 175.1 lb

## 2015-11-28 DIAGNOSIS — K635 Polyp of colon: Secondary | ICD-10-CM

## 2015-11-28 DIAGNOSIS — R1011 Right upper quadrant pain: Secondary | ICD-10-CM

## 2015-11-28 DIAGNOSIS — D1803 Hemangioma of intra-abdominal structures: Secondary | ICD-10-CM

## 2015-11-28 NOTE — Progress Notes (Signed)
HPI :  HISTORY 09/21/15 50 y/o female here for evaluation for abdominal pain. She has been followed by Dr. Olevia Perches previously, she is new to me.   Patient has had some nagging intermittent pain in her upper abdomen. It has been ongoing for several months. Pain is in the RUQ to epihgastric area and radiates into her back at times. Sometimes she feels it for several days and sometimes does not feel it at all for a while. She can think of anything that triggers for her. Eating does not make the pain better or worse, she does not think eating is related at all. She denies any positional changes. When the pain occurs it will last for minutes at a time, but sometimes can last all day long. The pain is dull and nagging. Some days she will feel this and some days she will not. Pain has been stable over time, not worsening. She is eating okay. No nausea or vomiting. No early satiety. Pain has woken her from sleep in the past from this once. No changes in her bowel habits, they fluctuate depending on what she eats. Stool frequency is variable. No relation of bowel habits with her pain. No blood in the stools. She takes periodic NSAIDs, not using regularly. She has not taken NSAIDs in a while. She reports a history of Shingles in the area of her pain remotely.   Thus far she has had an [place] which showed no gallstones but possible hemangioma. Follow up MRI abdomen showed benign hepatic hemangioma 2 x 2 cm, stable since 2015, normal pancreas and biliary tree, no pathology noted to cause pain.   She has occasional heartburn, perhaps a few times per month, not significant. She has some occasional regurgitation. On ROS she does endorse some occasional dysphagia, with food get caught up in her mid chest. Sometimes she needs to stop eating and drink fluids to push it down. She thinks this has been ongoing for a long time, symptoms periodic, no prior upper endoscopy.   No FH of colon cancer. Father had UC. Grandfather had  gastric cancer. No pancreatic cancer.   Colonoscopy history: Colonoscopy 8/13 - one serrated adenoma Colonoscopy 6/16 - benign sigmoid polyp  SINCE LAST VISIT:  EGD was performed showing no significant abnormalities - biopsies of stomach, duodenum, and esophagus normal. No pathology noted to account for her symptoms. Patient reports her symptoms have since resolved. She denies any abdominal pain, resolved about a month ago.  She denies anything which she thinks would have been related. She states it has come and gone in the past. She asks an opinion about probiotics, asking if she should take this for her general health when she otherwise feels well. Eating okay, no issues today.    Past Medical History:  Diagnosis Date  . Adenomatous colon polyp 2013  . Anxiety   . Cold    with low grade fever, surgery cancelled 01-08-15 due to cold  . Depression   . History of chicken pox   . Hot flashes   . Hyperparathyroidism ([place])    s/p R inf parathroidectomy in 2016; seeing Dr Cruzita Lederer, endocrinology  . Kidney stone    sees Dr. Louis Meckel, urology  . UTI (lower urinary tract infection)   . Vitamin D deficiency      Past Surgical History:  Procedure Laterality Date  . CESAREAN SECTION  2003  . COLONOSCOPY  10-04-14   per Dr. Olevia Perches, benign polyps, repeat in 10 yrs   .  PARATHYROIDECTOMY Right 01/19/2015   Procedure: RIGHT INFERIOR PARATHYROIDECTOMY;  Surgeon: Armandina Gemma, MD;  Location: [facility];  Service: General;  Laterality: Right;  . POLYPECTOMY  11-26-2011   sessile serrated polyp  . TONSILLECTOMY  1992  . UMBILICAL HERNIA REPAIR  1977   Family History  Problem Relation Age of Onset  . Ulcerative colitis Father   . Deep vein thrombosis Father   . Pulmonary embolism Father   . Hypertension Father   . Colon polyps Father   . Hyperlipidemia Mother     and father  . Osteoarthritis Mother   . Gastric cancer Maternal Grandfather   . Stomach cancer Maternal Grandfather   . Colon cancer  Neg Hx   . Rectal cancer Neg Hx   . Diabetes Neg Hx    Social History  Substance Use Topics  . Smoking status: Never Smoker  . Smokeless tobacco: Never Used  . Alcohol use 0.0 oz/week     Comment: 1-2 drinks a few nights per week-socially   Current Outpatient Prescriptions  Medication Sig Dispense Refill  . Cholecalciferol (VITAMIN D-3 PO) Take 2,000 Units by mouth daily.    . Cyanocobalamin (VITAMIN B-12) 1000 MCG SUBL Place under the tongue daily.     No current facility-administered medications for this visit.    Allergies  Allergen Reactions  . Percocet [Oxycodone-Acetaminophen] Nausea Only    Makes pt feel nausea - prefers not to take     Review of Systems: All systems reviewed and negative except where noted in HPI.   Lab Results  Component Value Date   WBC 4.1 08/22/2015   HGB 13.0 08/22/2015   HCT 38.3 08/22/2015   MCV 87.6 08/22/2015   PLT 197.0 08/22/2015   Lab Results  Component Value Date   CREATININE 0.73 08/22/2015   BUN 15 08/22/2015   NA 139 08/22/2015   K 4.1 08/22/2015   CL 106 08/22/2015   CO2 27 08/22/2015    Lab Results  Component Value Date   ALT 12 08/22/2015   AST 14 08/22/2015   ALKPHOS 78 08/22/2015   BILITOT 0.6 08/22/2015     Physical Exam: BP 122/70 (BP Location: Left Arm, Patient Position: Sitting, Cuff Size: Normal)   Pulse 74   Ht 5\' 8"  (1.727 m)   Wt 175 lb 2 oz (79.4 kg)   LMP 06/05/2013   BMI 26.63 kg/m  Constitutional: Pleasant,well-developed, female in no acute distress. HEENT: Normocephalic and atraumatic. Conjunctivae are normal. No scleral icterus. Cardiovascular: Normal rate, regular rhythm.  Pulmonary/chest: Effort normal and breath sounds normal. No wheezing, rales or rhonchi. Abdominal: Soft, nondistended, nontender. Bowel sounds active throughout. There are no masses palpable. No hepatomegaly. Extremities: no edema Lymphadenopathy: No cervical adenopathy noted. Neurological: Alert and oriented to person  place and time. Skin: Skin is warm and dry. No rashes noted. Psychiatric: Normal mood and affect. Behavior is normal.   ASSESSMENT AND PLAN: 50 y/o female here for reassessment of the following issues:  Abdominal pain - described as above, previously bothering her several months but has since resolved completely. She has had [place], MRI abdomen showing hepatic hemangioma without any other pathology. EGD was normal. Pain was not c/w biliary colic. Perhaps this was due to abdominal wall pain. She will monitor for recurrence, if she has symptoms in the future she can contact me for reassessment. Of note, she asked about probiotics for her general health. She states she developed gas/bloating when taking them in the past and did  not tolerate it well. She has no specific indications for probitiocs and there is no data to show benefit for routine use, would not recommend she take it at this time. All questions answered.   Hepatic hemangioma - small, very unlikely related to any of her symptoms. Stable on imaging over 2 years, no further surveillance is recommended.   History of serrated adenoma - removed in 2013 which led to a surveillance exam in 2016, which was normal. Per guidelines, her next surveillance exam should be done in 5 years from her last exam, so due in 2021  Hyndman Cellar, MD [facility] Pager [phone]

## 2015-11-28 NOTE — Patient Instructions (Signed)
Your colonoscopy recall is in 2021 Follow up as needed

## 2015-12-02 ENCOUNTER — Encounter: Payer: Self-pay | Admitting: Family Medicine

## 2016-01-14 ENCOUNTER — Ambulatory Visit (INDEPENDENT_AMBULATORY_CARE_PROVIDER_SITE_OTHER): Payer: BLUE CROSS/BLUE SHIELD | Admitting: Family Medicine

## 2016-01-14 ENCOUNTER — Encounter: Payer: Self-pay | Admitting: Family Medicine

## 2016-01-14 VITALS — BP 106/60 | HR 78 | Temp 98.4°F | Ht 68.0 in | Wt 175.1 lb

## 2016-01-14 DIAGNOSIS — E559 Vitamin D deficiency, unspecified: Secondary | ICD-10-CM | POA: Diagnosis not present

## 2016-01-14 DIAGNOSIS — E538 Deficiency of other specified B group vitamins: Secondary | ICD-10-CM

## 2016-01-14 DIAGNOSIS — Z23 Encounter for immunization: Secondary | ICD-10-CM

## 2016-01-14 DIAGNOSIS — E210 Primary hyperparathyroidism: Secondary | ICD-10-CM | POA: Diagnosis not present

## 2016-01-14 DIAGNOSIS — R7989 Other specified abnormal findings of blood chemistry: Secondary | ICD-10-CM

## 2016-01-14 LAB — VITAMIN B12: Vitamin B-12: 856 pg/mL (ref 211–911)

## 2016-01-14 LAB — VITAMIN D 25 HYDROXY (VIT D DEFICIENCY, FRACTURES): VITD: 35.13 ng/mL (ref 30.00–100.00)

## 2016-01-14 NOTE — Progress Notes (Signed)
HPI:  Abd pain: -seeing GI -s/p unremarkable labs, [place], MRI, EGD -UTD on colonosocpy per GI letter review -resolved completely  GAD/mild depression: -chronic, on zoloft and lexapro in the past, reports doesn't want to take medications -worries all the time, fatigue, poor motivation -no SI, panic, manic symptoms  Hyperparathyroidism: -hx nephrolithiasis - seeing Dr. Louis Meckel, [facility] -seeing endo (Dr. Cruzita Lederer), s/p R inf parathyroidectomy 12/2014 (Dr. Harlow [facility]) -she advised didn't know if needed further endo follow up -other then abd pain (see above, now resolved) - no other symptoms  Vit D Def: -on vit D with endocrinologist  B12 def: -taking sl b12 -denies:  Many moles: -sees dermatologist (Dr. Elvera Lennox)  ROS: See pertinent positives and negatives per HPI.  Past Medical History:  Diagnosis Date  . Adenomatous colon polyp 2013  . Anxiety   . Cold    with low grade fever, surgery cancelled 01-08-15 due to cold  . Depression   . History of chicken pox   . Hot flashes   . Hyperparathyroidism ([place])    s/p R inf parathroidectomy in 2016; seeing Dr Cruzita Lederer, endocrinology  . Kidney stone    sees Dr. Louis Meckel, urology  . UTI (lower urinary tract infection)   . Vitamin D deficiency     Past Surgical History:  Procedure Laterality Date  . CESAREAN SECTION  2003  . COLONOSCOPY  10-04-14   per Dr. Olevia Perches, benign polyps, repeat in 10 yrs   . PARATHYROIDECTOMY Right 01/19/2015   Procedure: RIGHT INFERIOR PARATHYROIDECTOMY;  Surgeon: Armandina Gemma, MD;  Location: [facility];  Service: General;  Laterality: Right;  . POLYPECTOMY  11-26-2011   sessile serrated polyp  . TONSILLECTOMY  1992  . UMBILICAL HERNIA REPAIR  1977    Family History  Problem Relation Age of Onset  . Ulcerative colitis Father   . Deep vein thrombosis Father   . Pulmonary embolism Father   . Hypertension Father   . Colon polyps Father   . Hyperlipidemia Mother     and father   . Osteoarthritis Mother   . Gastric cancer Maternal Grandfather   . Stomach cancer Maternal Grandfather   . Colon cancer Neg Hx   . Rectal cancer Neg Hx   . Diabetes Neg Hx     Social History   Social History  . Marital status: Married    Spouse name: N/A  . Number of children: N/A  . Years of education: N/A   Social History Main Topics  . Smoking status: Never Smoker  . Smokeless tobacco: Never Used  . Alcohol use 0.0 oz/week     Comment: 1-2 drinks a few nights per week-socially  . Drug use: No  . Sexual activity: Not Asked   Other Topics Concern  . None   Social History Narrative   Work or School: homemaker      Home Situation: lives with husband and 58 yo son in 2015      Spiritual Beliefs: Christian      Lifestyle: no regular exercise; diet is fair           Current Outpatient Prescriptions:  .  Cholecalciferol (VITAMIN D-3 PO), Take 2,000 Units by mouth daily., Disp: , Rfl:  .  Cyanocobalamin (VITAMIN B-12) 1000 MCG SUBL, Place under the tongue daily., Disp: , Rfl:   EXAM:  Vitals:   01/14/16 1101  BP: 106/60  Pulse: 78  Temp: 98.4 F (36.9 C)    Body mass index is 26.62  kg/m.  GENERAL: vitals reviewed and listed above, alert, oriented, appears well hydrated and in no acute distress  HEENT: atraumatic, conjunttiva clear, no obvious abnormalities on inspection of external nose and ears  NECK: no obvious masses on inspection  LUNGS: clear to auscultation bilaterally, no wheezes, rales or rhonchi, good air movement  CV: HRRR, no peripheral edema  MS: moves all extremities without noticeable abnormality  PSYCH: pleasant and cooperative, no obvious depression or anxiety  ASSESSMENT AND PLAN:  Discussed the following assessment and plan:  Low serum vitamin B12 - Plan: Vitamin B12  Vitamin D deficiency - Plan: Vitamin D, 25-hydroxy  Hyperparathyroidism, primary ([place])  -labs per orders -healthy lifestyle -flu shot -reviewed last endo  notes and 1 year follow up advised, info provided to pt -Patient advised to return or notify a doctor immediately if symptoms worsen or persist or new concerns arise.  Patient Instructions  BEFORE YOU LEAVE: -MRI report -Flu shot -follow up: 6 months -labs  I reviewed your last endocrinology notes and she advised that your follow up with her in 1 year in November of 2017.   We have ordered labs or studies at this visit. It can take up to 1-2 weeks for results and processing. IF results require follow up or explanation, we will call you with instructions. Clinically stable results will be released to your [facility]. If you have not heard from [place] or cannot find your results in [facility] in 2 weeks please contact our office at [phone].  If you are not yet signed up for [facility], please consider signing up.   We recommend the following healthy lifestyle for LIFE: 1) Small portions.   Tip: eat off of a salad plate instead of a dinner plate.  Tip: It is ok to feel hungry after a meal - that likely means you ate an appropriate portion.  Tip: if you need more or a snack choose fruits, veggies and/or a handful of nuts or seeds.  2) Eat a healthy clean diet.  * Tip: Avoid (less then 1 serving per week): processed foods, sweets, sweetened drinks, white starches (rice, flour, bread, potatoes, pasta, etc), red meat, fast foods, butter  *Tip: CHOOSE instead   * 5-9 servings per day of fresh or frozen fruits and vegetables (but not corn, potatoes, bananas, canned or dried fruit)   *nuts and seeds, beans   *olives and olive oil   *small portions of lean meats such as fish and white chicken    *small portions of whole grains  3)Get at least 150 minutes of sweaty aerobic exercise per week.  4)Reduce stress - consider counseling, meditation and relaxation to balance other aspects of your life.           Colin Benton R., DO

## 2016-01-14 NOTE — [place] Notes (Signed)
Pre visit review using our clinic review tool, if applicable. No additional management support is needed unless otherwise documented below in the visit note. 

## 2016-01-14 NOTE — Patient Instructions (Signed)
BEFORE YOU LEAVE: -MRI report -Flu shot -follow up: 6 months -labs  I reviewed your last endocrinology notes and she advised that your follow up with her in 1 year in November of 2017.   We have ordered labs or studies at this visit. It can take up to 1-2 weeks for results and processing. IF results require follow up or explanation, we will call you with instructions. Clinically stable results will be released to your [facility]. If you have not heard from [place] or cannot find your results in [facility] in 2 weeks please contact our office at [phone].  If you are not yet signed up for [facility], please consider signing up.   We recommend the following healthy lifestyle for LIFE: 1) Small portions.   Tip: eat off of a salad plate instead of a dinner plate.  Tip: It is ok to feel hungry after a meal - that likely means you ate an appropriate portion.  Tip: if you need more or a snack choose fruits, veggies and/or a handful of nuts or seeds.  2) Eat a healthy clean diet.  * Tip: Avoid (less then 1 serving per week): processed foods, sweets, sweetened drinks, white starches (rice, flour, bread, potatoes, pasta, etc), red meat, fast foods, butter  *Tip: CHOOSE instead   * 5-9 servings per day of fresh or frozen fruits and vegetables (but not corn, potatoes, bananas, canned or dried fruit)   *nuts and seeds, beans   *olives and olive oil   *small portions of lean meats such as fish and white chicken    *small portions of whole grains  3)Get at least 150 minutes of sweaty aerobic exercise per week.  4)Reduce stress - consider counseling, meditation and relaxation to balance other aspects of your life.

## 2016-03-18 ENCOUNTER — Encounter: Payer: Self-pay | Admitting: Internal Medicine

## 2016-03-18 ENCOUNTER — Ambulatory Visit (INDEPENDENT_AMBULATORY_CARE_PROVIDER_SITE_OTHER): Payer: BLUE CROSS/BLUE SHIELD | Admitting: Internal Medicine

## 2016-03-18 VITALS — BP 136/80 | HR 107 | Wt 179.0 lb

## 2016-03-18 DIAGNOSIS — M816 Localized osteoporosis [Lequesne]: Secondary | ICD-10-CM | POA: Diagnosis not present

## 2016-03-18 DIAGNOSIS — Z8639 Personal history of other endocrine, nutritional and metabolic disease: Secondary | ICD-10-CM

## 2016-03-18 DIAGNOSIS — M810 Age-related osteoporosis without current pathological fracture: Secondary | ICD-10-CM | POA: Insufficient documentation

## 2016-03-18 DIAGNOSIS — E559 Vitamin D deficiency, unspecified: Secondary | ICD-10-CM | POA: Diagnosis not present

## 2016-03-18 NOTE — Patient Instructions (Addendum)
Please stop at the lab.  Please increase vitamin D to 4000 units daily.  Please return in 1 year.  Exercise for Strong Bones (from [employer]) There are two types of exercises that are important for building and maintaining bone density:  weight-bearing and muscle-strengthening exercises. Weight-bearing Exercises These exercises include activities that make you move against gravity while staying upright. Weight-bearing exercises can be high-impact or low-impact. High-impact weight-bearing exercises help build bones and keep them strong. If you have broken a bone due to osteoporosis or are at risk of breaking a bone, you may need to avoid high-impact exercises. If you're not sure, you should check with your healthcare provider. Examples of high-impact weight-bearing exercises are: . Dancing . Doing high-impact aerobics . Hiking . Jogging/running . Jumping Rope . Stair climbing . Tennis Low-impact weight-bearing exercises can also help keep bones strong and are a safe alternative if you cannot do high-impact exercises. Examples of low-impact weight-bearing exercises are: . Using elliptical training machines . Doing low-impact aerobics . Using stair-step machines . Fast walking on a treadmill or outside Muscle-Strengthening Exercises These exercises include activities where you move your body, a weight or some other resistance against gravity. They are also known as resistance exercises and include: . Lifting weights . Using elastic exercise bands . Using weight machines . Lifting your own body weight . Functional movements, such as standing and rising up on your toes Yoga and Pilates can also improve strength, balance and flexibility. However, certain positions may not be safe for people with osteoporosis or those at increased risk of broken bones. For example, exercises that have you bend forward may increase the chance of breaking a bone in the spine. A physical  therapist should be able to help you learn which exercises are safe and appropriate for you. Non-Impact Exercises Non-impact exercises can help you to improve balance, posture and how well you move in everyday activities. These exercises can also help to increase muscle strength and decrease the risk of falls and broken bones. Some of these exercises include: . Balance exercises that strengthen your legs and test your balance, such as Tai Chi, can decrease your risk of falls. . Posture exercises that improve your posture and reduce rounded or "sloping" shoulders can help you decrease the chance of breaking a bone, especially in the spine. . Functional exercises that improve how well you move can help you with everyday activities and decrease your chance of falling and breaking a bone. For example, if you have trouble getting up from a chair or climbing stairs, you should do these activities as exercises. A physical therapist can teach you balance, posture and functional exercises. Starting a New Exercise Program If you haven't exercised regularly for a while, check with your healthcare provider before beginning a new exercise program-particularly if you have health problems such as heart disease, diabetes or high blood pressure. If you're at high risk of breaking a bone, you should work with a physical therapist to develop a safe exercise program. Once you have your healthcare provider's approval, start slowly. If you've already broken bones in the spine because of osteoporosis, be very careful to avoid activities that require reaching down, bending forward, rapid twisting motions, heavy lifting and those that increase your chance of a fall. As you get started, your muscles may feel sore for a day or two after you exercise. If soreness lasts longer, you may be working too hard and need to ease up. Exercises should be  done in a pain-free range of motion. How Much Exercise Do You Need? Weight-bearing  exercises 30 minutes on most days of the week. Do a 30-minutesession or multiple sessions spread out throughout the day. The benefits to your bones are the same.   Muscle-strengthening exercises Two to three days per week. If you don't have much time for strengthening/resistance training, do small amounts at a time. You can do just one body part each day. For example do arms one day, legs the next and trunk the next. You can also spread these exercises out during your normal day.  Balance, posture and functional exercises Every day or as often as needed. You may want to focus on one area more than the others. If you have fallen or lose your balance, spend time doing balance exercises. If you are getting rounded shoulders, work more on posture exercises. If you have trouble climbing stairs or getting up from the couch, do more functional exercises. You can also perform these exercises at one time or spread them during your day. Work with a phyiscal therapist to learn the right exercises for you.

## 2016-03-18 NOTE — Progress Notes (Signed)
Patient ID: Patricia Ware, female   DOB: 10/19/1965, 50 y.o.   MRN: [id number]  HPI  Patricia Ware is a 50 y.o.-year-old female, returning for follow-up for primary hyperparathyroidism and vitamin D deficiency. Last visit 1 year ago.  Reviewed and addended history: Pt was dx with hypercalcemia in 2014. I reviewed pt's pertinent labs: Lab Results  Component Value Date   PTH 132 (H) 11/01/2014   PTH 119 (H) 06/01/2014   CALCIUM 8.9 08/22/2015   CALCIUM 10.5 (H) 01/03/2015   CALCIUM 10.2 11/01/2014   CALCIUM 10.5 06/01/2014  10/26/2012: Ca 10.2 (8.4-10.5) 09/21/2012: Ca 10.4 (8.7-10.2)  No fractures or falls.   + h/o kidney stones. First in late 20's, repeated episodes in the last 8 years. She has one now. She saw Urology.  No stone was captured. She had further investigation by urology, and we'll try to get these records.  No h/o CKD. Last BUN/Cr: Lab Results  Component Value Date   BUN 15 08/22/2015   CREATININE 0.73 08/22/2015   Pt is not on HCTZ.  Investigation for primary hyperparathyroidism was positive: Component     Latest Ref Rng 11/01/2014  Calcium     8.4 - 10.5 mg/dL 10.2  GFR     >60.00 mL/min 95.69  Vitamin D 1, 25 (OH) Total     18 - 72 pg/mL 113 (H)  Vitamin D3 1, 25 (OH)      113  Vitamin D2 1, 25 (OH)      <8  PTH     14 - 64 pg/mL 132 (H)  Phosphorus     2.3 - 4.6 mg/dL 2.3  Magnesium     1.5 - 2.5 mg/dL 1.9   Records from patient's urologist, Dr. Louis Meckel from 10/10/2014: 24 hour urine calcium was 407 mg a day, which is elevated. Her urine volume was 1.5 L. Calcium oxalate saturation, was also high, at 12.33, ideally <5. She also has an elevated saturation of calcium phosphate 2.27, ideally <1.2.  Patient was referred to surgery >> had right inferior parathyroidectomy on 01/19/2015 (Dr. Harlow Asa). The pathology showed an adenoma of 1.1 g.   She felt much better after the surgery "like a weight has been lifted off on me".  A recent 24h  Urine by Dr. Louis Meckel >> 301 mg a day. Also, Na was high (233 mmol/d) - she was advised to cut down sodium, as a high urinary sodium can cause hypercalciuria.  She also brings with her records from her recent bone density report (03/06/2016) from [facility] that shows osteoporosis: L1-L4: T-score -3.3 LFN: -1.0 RFN: -1.4  She also has a history of vitamin D deficiency: - Latest vitamin D: Lab Results  Component Value Date   VD25OH 35.13 01/14/2016   VD25OH 27.98 (L) 10/30/2014   VD25OH 22.14 (L) 08/24/2014   She is on 2000 IU daily, did not start 4000 international units daily as suggested.  ROS: Constitutional: no weight gain/loss, no fatigue, no subjective hyperthermia/hypothermia Eyes: no blurry vision, no xerophthalmia ENT: no sore throat, no nodules palpated in throat, no dysphagia/odynophagia, no hoarseness Cardiovascular: no CP/SOB/palpitations/leg swelling Respiratory: no cough/SOB Gastrointestinal: no N/V/D/C Musculoskeletal: no muscle/joint aches Skin: no rashes Neurological: no tremors/numbness/tingling/dizziness  I reviewed pt's medications, allergies, PMH, social hx, family hx, and changes were documented in the history of present illness. Otherwise, unchanged from my initial visit note.  Past Medical History:  Diagnosis Date  . Adenomatous colon polyp 2013  . Anxiety   . Cold  with low grade fever, surgery cancelled 01-08-15 due to cold  . Depression   . History of chicken pox   . Hot flashes   . Hyperparathyroidism ([place])    s/p R inf parathroidectomy in 2016; seeing Dr Cruzita Lederer, endocrinology  . Kidney stone    sees Dr. Louis Meckel, urology  . UTI (lower urinary tract infection)   . Vitamin D deficiency    Past Surgical History:  Procedure Laterality Date  . CESAREAN SECTION  2003  . COLONOSCOPY  10-04-14   per Dr. Olevia Perches, benign polyps, repeat in 10 yrs   . PARATHYROIDECTOMY Right 01/19/2015   Procedure: RIGHT INFERIOR PARATHYROIDECTOMY;   Surgeon: Armandina Gemma, MD;  Location: [facility];  Service: General;  Laterality: Right;  . POLYPECTOMY  11-26-2011   sessile serrated polyp  . TONSILLECTOMY  1992  . UMBILICAL HERNIA REPAIR  1977   History   Social History  . Marital Status: Married    Spouse Name: N/A  . children 1   Social History Main Topics  . Smoking status: Never Smoker   . Smokeless tobacco: Not on file  . Alcohol Use: Yes     Comment: 6 wine drinks per week  . Drug Use: No   Social History Narrative   Work or School: homemaker      Home Situation: lives with husband and 74 yo son in 2015      Spiritual Beliefs: Christian      Lifestyle: no regular exercise; diet is fair         Current Outpatient Prescriptions on File Prior to Visit  Medication Sig Dispense Refill  . Cholecalciferol (VITAMIN D-3 PO) Take 2,000 Units by mouth daily.    . Cyanocobalamin (VITAMIN B-12) 1000 MCG SUBL Place under the tongue daily.     No current facility-administered medications on file prior to visit.    Allergies  Allergen Reactions  . Percocet [Oxycodone-Acetaminophen] Nausea Only    Makes pt feel nausea - prefers not to take   Family History  Problem Relation Age of Onset  . Ulcerative colitis Father   . Deep vein thrombosis Father   . Pulmonary embolism Father   . Hypertension Father   . Colon polyps Father   . Hyperlipidemia Mother     and father  . Osteoarthritis Mother   . Gastric cancer Maternal Grandfather   . Stomach cancer Maternal Grandfather   . Colon cancer Neg Hx   . Rectal cancer Neg Hx   . Diabetes Neg Hx    PE: BP 136/80   Pulse (!) 107   Wt 179 lb (81.2 kg)   LMP 06/05/2049   SpO2 98%   BMI 27.22 kg/m  Body mass index is 27.22 kg/m. Wt Readings from Last 3 Encounters:  03/18/16 179 lb (81.2 kg)  01/14/16 175 lb 1.6 oz (79.4 kg)  11/28/15 175 lb 2 oz (79.4 kg)   Constitutional: overweight, in NAD. No kyphosis. Eyes: PERRLA, EOMI, no exophthalmos ENT: moist mucous membranes, no  thyromegaly, surgical site healed; no cervical lymphadenopathy Cardiovascular: Tachycardia,, RR, No MRG Respiratory: CTA B Gastrointestinal: abdomen soft, NT, ND, BS+ Musculoskeletal: no deformities, strength intact in all 4 Skin: moist, warm, no rashes Neurological: no tremor with outstretched hands, DTR normal in all 4  Assessment: 1. Hypercalcemia/hyperparathyroidism  2. Vitamin D deficiency  3. Osteoporosis  Plan: 1. Patient with history of primary hyperparathyroidism associated with hypercalciuria, now s/p right inferior parathyroidectomy by Dr. Harlow Asa on 01/19/2015. Her calcium normalized after  her surgery. She is also feeling better. A 24-hour urine calcium checked by her urologist continued to show hypercalcemia, although improved from before. It is believed that this was due to high sodium intake since her urine sodium in the same collection was 233 mmol per day. She was advised to cut down sodium in her diet. - She is feeling much better after the surgery - Today we will check a PTH and a calcium - Return in about 1 year (around 03/18/2017).  2. Vitamin D deficiency - Patient restarted 2000 units of vitamin D daily after last visit, despite advice to start 4000 units daily. Her recent vitamin D level was 35, normal, but still on the low side, so I advised her to increase to 4000 units daily  3. Osteoporosis - New diagnosis - We reviewed together her recent DEXA scan report and I explained the numbers. Her lumbar spine T score is quite low, and I advised her to start weightbearing exercises, to try to get at least 1000 units of calcium from her diet, and will also increase her vitamin D supplement dose. However, I would like to repeat a DEXA scan in one year, on the [employer]. Patient will contact me before our next appointment so I can order it. At that point, if the T scores are still low, we may need to start anti-resorptive medication.  Office Visit on 03/18/2016   Component Date Value Ref Range Status  . Calcium 03/19/2016 9.2  8.7 - 10.2 mg/dL Final  . PTH 03/19/2016 29  15 - 65 pg/mL Final  . PTH 03/19/2016 Comment   Final   Comment: Interpretation                 Intact PTH    Calcium                                 (pg/mL)      (mg/dL) Normal                          15 - 65     8.6 - 10.2 Primary Hyperparathyroidism         >65          >10.2 Secondary Hyperparathyroidism       >65          <10.2 Non-Parathyroid Hypercalcemia       <65          >10.2 Hypoparathyroidism                  <15          < 8.6 Non-Parathyroid Hypocalcemia    15 - 65          < 8.6    Labs are great!  CC: Dr. Louis Meckel - [facility]  Philemon Kingdom, MD PhD [facility]

## 2016-03-19 LAB — PTH, INTACT AND CALCIUM
Calcium: 9.2 mg/dL (ref 8.7–10.2)
PTH: 29 pg/mL (ref 15–65)

## 2016-03-26 ENCOUNTER — Encounter: Payer: Self-pay | Admitting: Family Medicine

## 2016-03-31 ENCOUNTER — Ambulatory Visit (INDEPENDENT_AMBULATORY_CARE_PROVIDER_SITE_OTHER): Payer: BLUE CROSS/BLUE SHIELD | Admitting: Family Medicine

## 2016-03-31 ENCOUNTER — Encounter: Payer: Self-pay | Admitting: Family Medicine

## 2016-03-31 VITALS — BP 102/60 | HR 100 | Temp 98.2°F | Ht 68.0 in | Wt 176.7 lb

## 2016-03-31 DIAGNOSIS — F411 Generalized anxiety disorder: Secondary | ICD-10-CM

## 2016-03-31 MED ORDER — SERTRALINE HCL 25 MG PO TABS: 25.0000 mg | ORAL_TABLET | Freq: Every day | ORAL | 3 refills | Status: DC

## 2016-03-31 NOTE — Patient Instructions (Signed)
BEFORE YOU LEAVE: -follow up: 4 weeks  Start the zoloft and take 25mg  once daily. Can increase to 50mg  in 1-2 weeks if needed.  Call to set up counseling.  Eat a healthy diet and get regular exercise.  Follow up sooner if worsening or new concerns.

## 2016-03-31 NOTE — [place] Notes (Signed)
Pre visit review using our clinic review tool, if applicable. No additional management support is needed unless otherwise documented below in the visit note. 

## 2016-03-31 NOTE — Progress Notes (Signed)
HPI:  Follow up Anxiety and Depression: -chronic -on various medications in the past (zoloft, lexapro) - then wanted to avoid medications -however, reports for many months her anxiety and mild depression have been overwhelming and she wants to pursue treatment -symptoms include: Generalized worry about everything all the time, occasional panic, feeling overwhelmed, irritability, mild depression -denies: Thoughts of self harm, suicidal ideation, hallucinations, manic symptoms -She reports she wants to try Zoloft, as this worked well for her in the past and did not seem to give her any side effects  ROS: See pertinent positives and negatives per HPI.  Past Medical History:  Diagnosis Date  . Adenomatous colon polyp 2013  . Anxiety   . Cold    with low grade fever, surgery cancelled 01-08-15 due to cold  . Depression   . History of chicken pox   . Hot flashes   . Hyperparathyroidism ([place])    s/p R inf parathroidectomy in 2016; seeing Dr Cruzita Lederer, endocrinology  . Kidney stone    sees Dr. Louis Meckel, urology  . UTI (lower urinary tract infection)   . Vitamin D deficiency     Past Surgical History:  Procedure Laterality Date  . CESAREAN SECTION  2003  . COLONOSCOPY  10-04-14   per Dr. Olevia Perches, benign polyps, repeat in 10 yrs   . PARATHYROIDECTOMY Right 01/19/2015   Procedure: RIGHT INFERIOR PARATHYROIDECTOMY;  Surgeon: Armandina Gemma, MD;  Location: [facility];  Service: General;  Laterality: Right;  . POLYPECTOMY  11-26-2011   sessile serrated polyp  . TONSILLECTOMY  1992  . UMBILICAL HERNIA REPAIR  1977    Family History  Problem Relation Age of Onset  . Ulcerative colitis Father   . Deep vein thrombosis Father   . Pulmonary embolism Father   . Hypertension Father   . Colon polyps Father   . Hyperlipidemia Mother     and father  . Osteoarthritis Mother   . Gastric cancer Maternal Grandfather   . Stomach cancer Maternal Grandfather   . Colon cancer Neg Hx   . Rectal cancer Neg Hx    . Diabetes Neg Hx     Social History   Social History  . Marital status: Married    Spouse name: N/A  . Number of children: N/A  . Years of education: N/A   Social History Main Topics  . Smoking status: Never Smoker  . Smokeless tobacco: Never Used  . Alcohol use 0.0 oz/week     Comment: 1-2 drinks a few nights per week-socially  . Drug use: No  . Sexual activity: Not Asked   Other Topics Concern  . None   Social History Narrative   Work or School: homemaker      Home Situation: lives with husband and 81 yo son in 2015      Spiritual Beliefs: Christian      Lifestyle: no regular exercise; diet is fair           Current Outpatient Prescriptions:  .  Cholecalciferol (VITAMIN D-3 PO), Take 4,000 Units by mouth daily. , Disp: , Rfl:  .  Cyanocobalamin (VITAMIN B-12) 1000 MCG SUBL, Place under the tongue daily., Disp: , Rfl:  .  sertraline (ZOLOFT) 25 MG tablet, Take 1 tablet (25 mg total) by mouth daily., Disp: 30 tablet, Rfl: 3  EXAM:  Vitals:   03/31/16 1439  BP: 102/60  Pulse: 100  Temp: 98.2 F (36.8 C)    Body mass index is 26.87 kg/m.  GENERAL: vitals  reviewed and listed above, alert, oriented, appears well hydrated and in no acute distress  HEENT: atraumatic, conjunttiva clear, no obvious abnormalities on inspection of external nose and ears  NECK: no obvious masses on inspection  LUNGS: clear to auscultation bilaterally, no wheezes, rales or rhonchi, good air movement  CV: HRRR, no peripheral edema  MS: moves all extremities without noticeable abnormality  PSYCH: pleasant and cooperative, anxious  ASSESSMENT AND PLAN:  Discussed the following assessment and plan:  GAD (generalized anxiety disorder)  -discussed treatment options -she would like to try CBT and zoloft - wants to try a low dose -follow up 4 weeks -Patient advised to return or notify a doctor immediately if symptoms worsen or persist or new concerns arise.  Patient  Instructions  BEFORE YOU LEAVE: -follow up: 4 weeks  Start the zoloft and take 25mg  once daily. Can increase to 50mg  in 1-2 weeks if needed.  Call to set up counseling.  Eat a healthy diet and get regular exercise.  Follow up sooner if worsening or new concerns.     Colin Benton R., DO

## 2016-04-28 ENCOUNTER — Encounter: Payer: Self-pay | Admitting: Family Medicine

## 2016-04-28 ENCOUNTER — Ambulatory Visit (INDEPENDENT_AMBULATORY_CARE_PROVIDER_SITE_OTHER): Payer: Managed Care, Other (non HMO) | Admitting: Family Medicine

## 2016-04-28 VITALS — BP 98/70 | HR 90 | Temp 97.7°F | Ht 68.0 in | Wt 179.6 lb

## 2016-04-28 DIAGNOSIS — F3342 Major depressive disorder, recurrent, in full remission: Secondary | ICD-10-CM

## 2016-04-28 DIAGNOSIS — F411 Generalized anxiety disorder: Secondary | ICD-10-CM | POA: Diagnosis not present

## 2016-04-28 MED ORDER — SERTRALINE HCL 50 MG PO TABS: 50.0000 mg | ORAL_TABLET | Freq: Every day | ORAL | 1 refills | Status: DC

## 2016-04-28 NOTE — Patient Instructions (Signed)
BEFORE YOU LEAVE: -follow up: Schedule physical in 4-6 months  Follow up sooner as needed.  I am so glad you are feeling better!

## 2016-04-28 NOTE — Progress Notes (Signed)
HPI:  Follow up GAD/MDD: -started zoloft 03/2016 -reports is doing great -doing cbt app on phone and walking daily -anxiety and depression resolved on 50mg  zoloft daily  ROS: See pertinent positives and negatives per HPI.  Past Medical History:  Diagnosis Date  . Adenomatous colon polyp 2013  . Anxiety   . Cold    with low grade fever, surgery cancelled 01-08-15 due to cold  . Depression   . History of chicken pox   . Hot flashes   . Hyperparathyroidism ([place])    s/p R inf parathroidectomy in 2016; seeing Dr Cruzita Lederer, endocrinology  . Kidney stone    sees Dr. Louis Meckel, urology  . UTI (lower urinary tract infection)   . Vitamin D deficiency     Past Surgical History:  Procedure Laterality Date  . CESAREAN SECTION  2003  . COLONOSCOPY  10-04-14   per Dr. Olevia Perches, benign polyps, repeat in 10 yrs   . PARATHYROIDECTOMY Right 01/19/2015   Procedure: RIGHT INFERIOR PARATHYROIDECTOMY;  Surgeon: Armandina Gemma, MD;  Location: [facility];  Service: General;  Laterality: Right;  . POLYPECTOMY  11-26-2011   sessile serrated polyp  . TONSILLECTOMY  1992  . UMBILICAL HERNIA REPAIR  1977    Family History  Problem Relation Age of Onset  . Ulcerative colitis Father   . Deep vein thrombosis Father   . Pulmonary embolism Father   . Hypertension Father   . Colon polyps Father   . Hyperlipidemia Mother     and father  . Osteoarthritis Mother   . Gastric cancer Maternal Grandfather   . Stomach cancer Maternal Grandfather   . Colon cancer Neg Hx   . Rectal cancer Neg Hx   . Diabetes Neg Hx     Social History   Social History  . Marital status: Married    Spouse name: N/A  . Number of children: N/A  . Years of education: N/A   Social History Main Topics  . Smoking status: Never Smoker  . Smokeless tobacco: Never Used  . Alcohol use 0.0 oz/week     Comment: 1-2 drinks a few nights per week-socially  . Drug use: No  . Sexual activity: Not Asked   Other Topics Concern  . None    Social History Narrative   Work or School: homemaker      Home Situation: lives with husband and 70 yo son in 2015      Spiritual Beliefs: Christian      Lifestyle: no regular exercise; diet is fair           Current Outpatient Prescriptions:  .  Cholecalciferol (VITAMIN D-3 PO), Take 4,000 Units by mouth daily. , Disp: , Rfl:  .  Cyanocobalamin (VITAMIN B-12) 1000 MCG SUBL, Place under the tongue daily., Disp: , Rfl:  .  sertraline (ZOLOFT) 50 MG tablet, Take 1 tablet (50 mg total) by mouth daily., Disp: 90 tablet, Rfl: 1  EXAM:  Vitals:   04/28/16 1308  BP: 98/70  Pulse: 90  Temp: 97.7 F (36.5 C)    Body mass index is 27.31 kg/m.  GENERAL: vitals reviewed and listed above, alert, oriented, appears well hydrated and in no acute distress  HEENT: atraumatic, conjunttiva clear, no obvious abnormalities on inspection of external nose and ears  NECK: no obvious masses on inspection  LUNGS: clear to auscultation bilaterally, no wheezes, rales or rhonchi, good air movement  CV: HRRR, no peripheral edema  MS: moves all extremities without noticeable abnormality  PSYCH: pleasant and cooperative, no obvious depression or anxiety  ASSESSMENT AND PLAN:  Discussed the following assessment and plan:  Recurrent major depressive disorder, in full remission ([place])  GAD (generalized anxiety disorder)  -so glad she is doing better -cont zoloft, cbt, exercise -cpe with f/u in 4-6 months -Patient advised to return or notify a doctor immediately if symptoms worsen or persist or new concerns arise.  Patient Instructions  BEFORE YOU LEAVE: -follow up: Schedule physical in 4-6 months  Follow up sooner as needed.  I am so glad you are feeling better!    Colin Benton R., DO

## 2016-04-28 NOTE — [place] Notes (Signed)
Pre visit review using our clinic review tool, if applicable. No additional management support is needed unless otherwise documented below in the visit note. 

## 2016-09-08 ENCOUNTER — Encounter: Payer: Self-pay | Admitting: Family Medicine

## 2016-09-10 DIAGNOSIS — F330 Major depressive disorder, recurrent, mild: Secondary | ICD-10-CM | POA: Insufficient documentation

## 2016-09-10 NOTE — Progress Notes (Signed)
HPI:  Here for CPE:  -Concerns and/or follow up today:  GAD/MDD - started zoloft 12/17 - lots of stress with husband changing jobs, 51 yo came out, moving to [place] - wants to keep on zoloft for now Hx low B12, normalized with oral b12 Hx hypercalcemia, hyperparathyroidism, osteoporosis, Vit D def - managed by endocrinologist, s/p R inf parathyroidectomy 12/2014; next f/u with endo 02/2017  Sees dermatologist for skin exams (Dr. Elvera Lennox) Sees gynecologist for pelvic, pap, breast health  -Diet: variety of foods, balance and well rounded  -Exercise: some exercise  -Taking folic acid, vitamin D or calcium: no  -Diabetes and Dyslipidemia Screening:fasting and wants to check cholesterol/etc  -Hx of HTN: no  -Vaccines: UTD  -pap history: does with gyn, reports sees gyn yearly and UTD  -FDLMP: 3 years ago  -sexual activity: yes, female partner, no new partners  -wants STI testing (Hep C if born 55-65): no  -FH breast, colon or ovarian ca: see FH Last mammogram: report UTD - breast exams done with gyn Last colon cancer screening: UTD  -Alcohol, Tobacco, drug use: see social history  Review of Systems - no fevers, unintentional weight loss, vision loss, hearing loss, chest pain, sob, hemoptysis, melena, hematochezia, hematuria, genital discharge, changing or concerning skin lesions, bleeding, bruising, loc, thoughts of self harm or SI  Past Medical History:  Diagnosis Date  . Adenomatous colon polyp 2013  . Anxiety   . Cold    with low grade fever, surgery cancelled 01-08-15 due to cold  . Depression   . History of chicken pox   . Hot flashes   . Hyperparathyroidism ([place])    s/p R inf parathroidectomy in 2016; seeing Dr Cruzita Lederer, endocrinology  . Kidney stone    sees Dr. Louis Meckel, urology  . UTI (lower urinary tract infection)   . Vitamin D deficiency     Past Surgical History:  Procedure Laterality Date  . CESAREAN SECTION  2003  . COLONOSCOPY  10-04-14   per Dr.  Olevia Perches, benign polyps, repeat in 10 yrs   . PARATHYROIDECTOMY Right 01/19/2015   Procedure: RIGHT INFERIOR PARATHYROIDECTOMY;  Surgeon: Armandina Gemma, MD;  Location: [facility];  Service: General;  Laterality: Right;  . POLYPECTOMY  11-26-2011   sessile serrated polyp  . TONSILLECTOMY  1992  . UMBILICAL HERNIA REPAIR  1977    Family History  Problem Relation Age of Onset  . Ulcerative colitis Father   . Deep vein thrombosis Father   . Pulmonary embolism Father   . Hypertension Father   . Colon polyps Father   . Hyperlipidemia Mother        and father  . Osteoarthritis Mother   . Gastric cancer Maternal Grandfather   . Stomach cancer Maternal Grandfather   . Colon cancer Neg Hx   . Rectal cancer Neg Hx   . Diabetes Neg Hx     Social History   Social History  . Marital status: Married    Spouse name: N/A  . Number of children: N/A  . Years of education: N/A   Social History Main Topics  . Smoking status: Never Smoker  . Smokeless tobacco: Never Used  . Alcohol use 0.0 oz/week     Comment: 1-2 drinks a few nights per week-socially  . Drug use: No  . Sexual activity: Not Asked   Other Topics Concern  . None   Social History Narrative   Work or School: homemaker      Home Situation: lives  with husband and 58 yo son in 2015      Spiritual Beliefs: Christian      Lifestyle: no regular exercise; diet is fair           Current Outpatient Prescriptions:  .  Cholecalciferol (VITAMIN D-3 PO), Take 4,000 Units by mouth daily. , Disp: , Rfl:  .  Cyanocobalamin (VITAMIN B-12) 1000 MCG SUBL, Place under the tongue daily., Disp: , Rfl:  .  sertraline (ZOLOFT) 50 MG tablet, Take 1 tablet (50 mg total) by mouth daily., Disp: 90 tablet, Rfl: 3  EXAM:  Vitals:   09/11/16 0857  BP: 118/80  Pulse: 74  Temp: 98.5 F (36.9 C)    GENERAL: vitals reviewed and listed below, alert, oriented, appears well hydrated and in no acute distress  HEENT: head atraumatic, PERRLA, normal  appearance of eyes, ears, nose and mouth. moist mucus membranes.  NECK: supple, no masses or lymphadenopathy  LUNGS: clear to auscultation bilaterally, no rales, rhonchi or wheeze  CV: HRRR, no peripheral edema or cyanosis, normal pedal pulses  ABDOMEN: bowel sounds normal, soft, non tender to palpation, no masses, no rebound or guarding  SKIN: no rash or abnormal lesions - declined full skin exam as sees dermatologist  GU/BREAST: declined - sees gyn  MS: normal gait, moves all extremities normally  NEURO: normal gait, speech and thought processing grossly intact, muscle tone grossly intact throughout  PSYCH: normal affect, pleasant and cooperative  ASSESSMENT AND PLAN:  Discussed the following assessment and plan:  Encounter for preventative adult health care examination - Plan: Lipid panel, Hemoglobin A1c  MDD (major depressive disorder), recurrent episode, mild (HCC)  GAD (generalized anxiety disorder)  Hyperparathyroidism, primary (HCC)  Low serum vitamin B12 - Plan: Vitamin B12   -Discussed and advised all [place] preventive services health task force level A and B recommendations for age, sex and risks.  -Advised at least 150 minutes of exercise per week and a healthy diet with avoidance of (less then 1 serving per week) processed foods, white starches, red meat, fast foods and sweets and consisting of: * 5-9 servings of fresh fruits and vegetables (not corn or potatoes) *nuts and seeds, beans *olives and olive oil *lean meats such as fish and white chicken  *whole grains  -FASTING labs, studies and vaccines per orders this encounter  -continue zoloft for now and advised adding CBT if possible - refills x1 year as likely will be moving; suggested Dr. Clarene Essex in [facility] where they plan to move and happy to send records if needed. She plans to let us know.  Orders Placed This Encounter  Procedures  . Lipid panel  . Hemoglobin A1c  . Vitamin B12     Patient advised to return to clinic immediately if symptoms worsen or persist or new concerns.  Patient Instructions  BEFORE YOU LEAVE: -follow up: 1 year unless transfer of care to new PCP when you move -labs  Continue zoloft, consider counseling.  See the endocrinologist in the fall as planned.  See dermatologist and gynecologist for gynecology exams and skin checks.  We have ordered labs or studies at this visit. It can take up to 1-2 weeks for results and processing. IF results require follow up or explanation, we will call you with instructions. Clinically stable results will be released to your [facility]. If you have not heard from [place] or cannot find your results in [facility] in 2 weeks please contact our office at [phone].  If you are  not yet signed up for [facility], please consider signing up.  Advise regular aerobic exercise (at least 150 minutes per week of sweaty exercise) and a healthy diet. Try to eat at least 5-9 servings of vegetables and fruits per day (not corn, potatoes or bananas.) Avoid sweets, red meat, pork, butter, fried foods, fast food, processed food, excessive dairy, eggs and coconut. Replace bad fats with good fats - fish, nuts and seeds, canola oil, olive oil.   WE NOW OFFER   [facility] FAST TRACK!!!  SAME DAY Appointments for ACUTE CARE  Such as: Sprains, Injuries, cuts, abrasions, rashes, muscle pain, joint pain, back pain Colds, flu, sore throats, headache, allergies, cough, fever  Ear pain, sinus and eye infections Abdominal pain, nausea, vomiting, diarrhea, upset stomach Animal/insect bites  3 Easy Ways to Schedule: Walk-In Scheduling Call in scheduling Mychart Sign-up: [email or web address]                 No Follow-up on file.  Colin Benton R., DO

## 2016-09-11 ENCOUNTER — Encounter: Payer: Self-pay | Admitting: Family Medicine

## 2016-09-11 ENCOUNTER — Ambulatory Visit (INDEPENDENT_AMBULATORY_CARE_PROVIDER_SITE_OTHER): Payer: BLUE CROSS/BLUE SHIELD | Admitting: Family Medicine

## 2016-09-11 VITALS — BP 118/80 | HR 74 | Temp 98.5°F | Ht 67.75 in | Wt 179.1 lb

## 2016-09-11 DIAGNOSIS — E210 Primary hyperparathyroidism: Secondary | ICD-10-CM

## 2016-09-11 DIAGNOSIS — F330 Major depressive disorder, recurrent, mild: Secondary | ICD-10-CM | POA: Diagnosis not present

## 2016-09-11 DIAGNOSIS — E538 Deficiency of other specified B group vitamins: Secondary | ICD-10-CM

## 2016-09-11 DIAGNOSIS — F411 Generalized anxiety disorder: Secondary | ICD-10-CM | POA: Diagnosis not present

## 2016-09-11 DIAGNOSIS — R7989 Other specified abnormal findings of blood chemistry: Secondary | ICD-10-CM

## 2016-09-11 DIAGNOSIS — Z0000 Encounter for general adult medical examination without abnormal findings: Secondary | ICD-10-CM

## 2016-09-11 LAB — LIPID PANEL
CHOL/HDL RATIO: 3
Cholesterol: 162 mg/dL (ref 0–200)
HDL: 49.8 mg/dL (ref >39.00)
LDL CALC: 96 mg/dL (ref 0–99)
NONHDL: 112.5
Triglycerides: 82 mg/dL (ref 0.0–149.0)
VLDL: 16.4 mg/dL (ref 0.0–40.0)

## 2016-09-11 LAB — HEMOGLOBIN A1C: HEMOGLOBIN A1C: 5.8 % (ref 4.6–6.5)

## 2016-09-11 LAB — VITAMIN B12: VITAMIN B 12: 939 pg/mL — AB (ref 211–911)

## 2016-09-11 MED ORDER — SERTRALINE HCL 50 MG PO TABS: 50.0000 mg | ORAL_TABLET | Freq: Every day | ORAL | 3 refills | Status: DC

## 2016-09-11 NOTE — Patient Instructions (Signed)
BEFORE YOU LEAVE: -follow up: 1 year unless transfer of care to new PCP when you move -labs  Continue zoloft, consider counseling.  See the endocrinologist in the fall as planned.  See dermatologist and gynecologist for gynecology exams and skin checks.  We have ordered labs or studies at this visit. It can take up to 1-2 weeks for results and processing. IF results require follow up or explanation, we will call you with instructions. Clinically stable results will be released to your [facility]. If you have not heard from [place] or cannot find your results in [facility] in 2 weeks please contact our office at [phone].  If you are not yet signed up for [facility], please consider signing up.  Advise regular aerobic exercise (at least 150 minutes per week of sweaty exercise) and a healthy diet. Try to eat at least 5-9 servings of vegetables and fruits per day (not corn, potatoes or bananas.) Avoid sweets, red meat, pork, butter, fried foods, fast food, processed food, excessive dairy, eggs and coconut. Replace bad fats with good fats - fish, nuts and seeds, canola oil, olive oil.   WE NOW OFFER   [facility] FAST TRACK!!!  SAME DAY Appointments for ACUTE CARE  Such as: Sprains, Injuries, cuts, abrasions, rashes, muscle pain, joint pain, back pain Colds, flu, sore throats, headache, allergies, cough, fever  Ear pain, sinus and eye infections Abdominal pain, nausea, vomiting, diarrhea, upset stomach Animal/insect bites  3 Easy Ways to Schedule: Walk-In Scheduling Call in scheduling Mychart Sign-up: [email or web address]

## 2016-10-11 ENCOUNTER — Other Ambulatory Visit: Payer: Self-pay | Admitting: Family Medicine

## 2016-10-23 ENCOUNTER — Encounter: Payer: Self-pay | Admitting: Family Medicine

## 2016-10-28 ENCOUNTER — Telehealth: Payer: Self-pay | Admitting: Family Medicine

## 2016-10-28 MED ORDER — SERTRALINE HCL 50 MG PO TABS
50.0000 mg | ORAL_TABLET | Freq: Every day | ORAL | 0 refills | Status: AC
Start: 2016-10-28 — End: ?

## 2016-10-28 NOTE — Telephone Encounter (Signed)
Rx done and I left a detailed message with this information at her cell number.

## 2016-10-28 NOTE — Telephone Encounter (Signed)
Pt has misplaced her Rx sertraline in the packing of her house and would like to see if you can call in at least #10.  [facility]  Pt would like to have a call when it is sent to pharmacy.

## 2017-01-08 ENCOUNTER — Encounter: Payer: Self-pay | Admitting: Family Medicine

## 2017-02-24 ENCOUNTER — Telehealth: Payer: Self-pay | Admitting: Family Medicine

## 2017-02-24 NOTE — Telephone Encounter (Signed)
Pt is taking plane flight on Friday and would like a medication she had in past for anxiety. Pt would like jo ann to return her call

## 2017-02-25 ENCOUNTER — Encounter: Payer: Self-pay | Admitting: Family Medicine

## 2017-02-25 NOTE — Telephone Encounter (Signed)
Ok to rx ativan 0.5mg . Take 30 minutes prior to flight once. #5. No refills.

## 2017-02-26 ENCOUNTER — Encounter: Payer: Self-pay | Admitting: Internal Medicine

## 2017-02-26 MED ORDER — LORAZEPAM 0.5 MG PO TABS: ORAL_TABLET | ORAL | 0 refills | Status: AC

## 2017-02-26 NOTE — Telephone Encounter (Signed)
Rx done and pt notified via Mychart message. 

## 2017-02-26 NOTE — Telephone Encounter (Signed)
Pt states [facility] called [facility] [place] and they do not have this prescription. Would like to know if you will call them at [phone]

## 2017-02-26 NOTE — Telephone Encounter (Signed)
[facility] and cancelled the Rx and this was [facility] per the pts request via Mychart message.

## 2017-02-26 NOTE — Telephone Encounter (Signed)
See Mychart note 

## 2017-02-27 ENCOUNTER — Other Ambulatory Visit: Payer: Self-pay | Admitting: Internal Medicine

## 2017-02-27 DIAGNOSIS — M816 Localized osteoporosis [Lequesne]: Secondary | ICD-10-CM

## 2017-03-06 ENCOUNTER — Telehealth: Payer: Self-pay

## 2017-03-06 NOTE — Telephone Encounter (Signed)
Called and scheduled patient for Bone Density. Called patient and advised of appointment, no other issues. Patient will be there.

## 2017-03-06 NOTE — Telephone Encounter (Signed)
error 

## 2017-03-06 NOTE — Telephone Encounter (Signed)
Please advise, okay to schedule bone density?

## 2017-03-06 NOTE — Telephone Encounter (Signed)
Yes, please do.

## 2017-03-06 NOTE — Telephone Encounter (Signed)
Patient called to reschedule her 03/18/17 appointment due to the urologist rescheduling with her- she also needs her bone density scheduled

## 2017-03-09 NOTE — Telephone Encounter (Signed)
Patient is calling because she want to cancel he bone density appt, please advise she was referred by dr Cruzita Lederer

## 2017-03-16 ENCOUNTER — Other Ambulatory Visit: Payer: BLUE CROSS/BLUE SHIELD

## 2017-03-18 ENCOUNTER — Ambulatory Visit: Payer: BLUE CROSS/BLUE SHIELD | Admitting: Internal Medicine

## 2017-03-24 ENCOUNTER — Ambulatory Visit (INDEPENDENT_AMBULATORY_CARE_PROVIDER_SITE_OTHER)
Admission: RE | Admit: 2017-03-24 | Discharge: 2017-03-24 | Disposition: A | Discharge status: Home / Self Care | Payer: BLUE CROSS/BLUE SHIELD | Source: Ambulatory Visit | Attending: Internal Medicine | Admitting: Internal Medicine

## 2017-03-24 DIAGNOSIS — M816 Localized osteoporosis [Lequesne]: Secondary | ICD-10-CM | POA: Diagnosis not present

## 2017-03-27 ENCOUNTER — Encounter: Payer: Self-pay | Admitting: Internal Medicine

## 2017-04-09 ENCOUNTER — Telehealth: Payer: Self-pay

## 2017-04-09 NOTE — Telephone Encounter (Signed)
Ok ty I had a my-chart message that she needed to have this done sorry for the confussion

## 2017-04-09 NOTE — Telephone Encounter (Signed)
I called to get the bone density scheduled for this patient but [facility] is stating there is not an order in for this-please advise if the patient still needs this done

## 2017-04-09 NOTE — Telephone Encounter (Signed)
She just had this done. I let her know the results already.

## 2017-04-09 NOTE — Telephone Encounter (Signed)
error 

## 2017-04-30 ENCOUNTER — Encounter: Payer: Self-pay | Admitting: Family Medicine

## 2017-05-27 ENCOUNTER — Encounter: Payer: Self-pay | Admitting: Internal Medicine

## 2017-05-27 ENCOUNTER — Ambulatory Visit: Payer: BLUE CROSS/BLUE SHIELD | Admitting: Internal Medicine

## 2017-05-27 VITALS — BP 120/62 | HR 88 | Temp 97.9°F | Ht 67.75 in | Wt 184.2 lb

## 2017-05-27 DIAGNOSIS — E210 Primary hyperparathyroidism: Secondary | ICD-10-CM

## 2017-05-27 DIAGNOSIS — M816 Localized osteoporosis [Lequesne]: Secondary | ICD-10-CM | POA: Diagnosis not present

## 2017-05-27 DIAGNOSIS — E559 Vitamin D deficiency, unspecified: Secondary | ICD-10-CM

## 2017-05-27 LAB — BASIC METABOLIC PANEL WITH GFR
BUN: 11 mg/dL (ref 7–25)
CALCIUM: 9.4 mg/dL (ref 8.6–10.4)
CO2: 30 mmol/L (ref 20–32)
Chloride: 105 mmol/L (ref 98–110)
Creat: 0.7 mg/dL (ref 0.50–1.05)
GFR, EST NON AFRICAN AMERICAN: 100 mL/min/{1.73_m2} (ref >=60)
GFR, Est African American: 115 mL/min/{1.73_m2} (ref >=60)
GLUCOSE: 81 mg/dL (ref 65–99)
POTASSIUM: 4.8 mmol/L (ref 3.5–5.3)
SODIUM: 141 mmol/L (ref 135–146)

## 2017-05-27 LAB — VITAMIN D 25 HYDROXY (VIT D DEFICIENCY, FRACTURES): VITD: 42.91 ng/mL (ref 30.00–100.00)

## 2017-05-27 NOTE — Patient Instructions (Addendum)
We will start a PA for Prolia.  Please stop at the lab.  Exercise for Strong Bones (from [place]) There are two types of exercises that are important for building and maintaining bone density:  weight-bearing and muscle-strengthening exercises. Weight-bearing Exercises These exercises include activities that make you move against gravity while staying upright. Weight-bearing exercises can be high-impact or low-impact. High-impact weight-bearing exercises help build bones and keep them strong. If you have broken a bone due to osteoporosis or are at risk of breaking a bone, you may need to avoid high-impact exercises. If youre not sure, you should check with your healthcare provider. Examples of high-impact weight-bearing exercises are:  Dancing  Doing high-impact aerobics  Hiking  Jogging/running  Jumping Rope  Stair climbing  Tennis Low-impact weight-bearing exercises can also help keep bones strong and are a safe alternative if you cannot do high-impact exercises. Examples of low-impact weight-bearing exercises are:  Using elliptical training machines  Doing low-impact aerobics  Using stair-step machines  Fast walking on a treadmill or outside Muscle-Strengthening Exercises These exercises include activities where you move your body, a weight or some other resistance against gravity. They are also known as resistance exercises and include:  Lifting weights  Using elastic exercise bands  Using weight machines  Lifting your own body weight  Functional movements, such as standing and rising up on your toes Yoga and Pilates can also improve strength, balance and flexibility. However, certain positions may not be safe for people with osteoporosis or those at increased risk of broken bones. For example, exercises that have you bend forward may increase the chance of breaking a bone in the spine. A physical therapist should be able to help you learn  which exercises are safe and appropriate for you. Non-Impact Exercises Non-impact exercises can help you to improve balance, posture and how well you move in everyday activities. These exercises can also help to increase muscle strength and decrease the risk of falls and broken bones. Some of these exercises include:  Balance exercises that strengthen your legs and test your balance, such as Tai Chi, can decrease your risk of falls.  Posture exercises that improve your posture and reduce rounded or sloping shoulders can help you decrease the chance of breaking a bone, especially in the spine.  Functional exercises that improve how well you move can help you with everyday activities and decrease your chance of falling and breaking a bone. For example, if you have trouble getting up from a chair or climbing stairs, you should do these activities as exercises. A physical therapist can teach you balance, posture and functional exercises. Starting a New Exercise Program If you havent exercised regularly for a while, check with your healthcare provider before beginning a new exercise program--particularly if you have health problems such as heart disease, diabetes or high blood pressure. If youre at high risk of breaking a bone, you should work with a physical therapist to develop a safe exercise program. Once you have your healthcare providers approval, start slowly. If youve already broken bones in the spine because of osteoporosis, be very careful to avoid activities that require reaching down, bending forward, rapid twisting motions, heavy lifting and those that increase your chance of a fall. As you get started, your muscles may feel sore for a day or two after you exercise. If soreness lasts longer, you may be working too hard and need to ease up. Exercises should be done in a pain-free range of motion.  How Much Exercise Do You Need? Weight-bearing exercises 30 minutes on most days of the week.  Do a 30-minutesession or multiple sessions spread out throughout the day. The benefits to your bones are the same.   Muscle-strengthening exercises Two to three days per week. If you dont have much time for strengthening/resistance training, do small amounts at a time. You can do just one body part each day. For example do arms one day, legs the next and trunk the next. You can also spread these exercises out during your normal day.  Balance, posture and functional exercises Every day or as often as needed. You may want to focus on one area more than the others. If you have fallen or lose your balance, spend time doing balance exercises. If you are getting rounded shoulders, work more on posture exercises. If you have trouble climbing stairs or getting up from the couch, do more functional exercises. You can also perform these exercises at one time or spread them during your day. Work with a phyiscal therapist to learn the right exercises for you.   Denosumab: Patient drug information (Up-to-date) Copyright [phone] [place] rights reserved.  Brand Names: [place]  ProliaDelton See What is this drug used for?  It is used to treat soft, brittle bones (osteoporosis).  It is used for bone growth.  It is used when treating some cancers.  It may be given to you for other reasons. Talk with the doctor. What do I need to tell my doctor BEFORE I take this drug?  All products:  If you have an allergy to denosumab or any other part of this drug.  If you are allergic to any drugs like this one, any other drugs, foods, or other substances. Tell your doctor about the allergy and what signs you had, like rash; hives; itching; shortness of breath; wheezing; cough; swelling of face, lips, tongue, or throat; or any other signs.  If you have low calcium levels.  Prolia:  If you are pregnant or may be pregnant. Do not take this drug if you are pregnant.  This is not a list of all drugs or health  problems that interact with this drug.  Tell your doctor and pharmacist about all of your drugs (prescription or OTC, natural products, vitamins) and health problems. You must check to make sure that it is safe for you to take this drug with all of your drugs and health problems. Do not start, stop, or change the dose of any drug without checking with your doctor. What are some things I need to know or do while I take this drug?  All products:  Tell dentists, surgeons, and other doctors that you use this drug.  This drug may raise the chance of a broken leg. Talk with your doctor.  Have your blood work checked. Talk with your doctor.  Have a bone density test. Talk with your doctor.  Take calcium and vitamin D as you were told by your doctor.  Have a dental exam before starting this drug.  Take good care of your teeth. See a dentist often.  If you smoke, talk with your doctor.  Do not give to a child. Talk with your doctor.  Tell your doctor if you are breast-feeding. You will need to talk about any risks to your baby.  Delton See:  This drug may cause harm to the unborn baby if you take it while you are pregnant. If you get pregnant while taking  this drug, call your doctor right away.  Prolia:  Very bad infections have been reported with use of this drug. If you have any infection, are taking antibiotics now or in the recent past, or have many infections, talk with your doctor.  You may have more chance of getting an infection. Wash hands often. Stay away from people with infections, colds, or flu.  Use birth control that you can trust to prevent pregnancy while taking this drug.  If you are a man and your sex partner is pregnant or gets pregnant at any time while you are being treated, talk with your doctor. What are some side effects that I need to call my doctor about right away?  WARNING/CAUTION: Even though it may be rare, some people may have very bad and sometimes deadly  side effects when taking a drug. Tell your doctor or get medical help right away if you have any of the following signs or symptoms that may be related to a very bad side effect:  All products:  Signs of an allergic reaction, like rash; hives; itching; red, swollen, blistered, or peeling skin with or without fever; wheezing; tightness in the chest or throat; trouble breathing or talking; unusual hoarseness; or swelling of the mouth, face, lips, tongue, or throat.  Signs of low calcium levels like muscle cramps or spasms, numbness and tingling, or seizures.  Mouth sores.  Any new or strange groin, hip, or thigh pain.  This drug may cause jawbone problems. The chance may be higher the longer you take this drug. The chance may be higher if you have cancer, dental problems, dentures that do not fit well, anemia, blood clotting problems, or an infection. The chance may also be higher if you are having dental work or if you are getting chemo, some steroid drugs, or radiation. Call your doctor right away if you have jaw swelling or pain.  Xgeva:  Not hungry.  Muscle pain or weakness.  Seizures.  Shortness of breath.  Prolia:  Signs of infection. These include a fever of 100.20F (38C) or higher, chills, very bad sore throat, ear or sinus pain, cough, more sputum or change in color of sputum, pain with passing urine, mouth sores, wound that will not heal, or anal itching or pain.  Signs of a pancreas problem (pancreatitis) like very bad stomach pain, very bad back pain, or very bad upset stomach or throwing up.  Chest pain.  A heartbeat that does not feel normal.  Very bad skin irritation.  Feeling very tired or weak.  Bladder pain or pain when passing urine or change in how much urine is passed.  Passing urine often.  Swelling in the arms or legs. What are some other side effects of this drug?  All drugs may cause side effects. However, many people have no side effects or only  have minor side effects. Call your doctor or get medical help if any of these side effects or any other side effects bother you or do not go away:  Xgeva:  Feeling tired or weak.  Headache.  Upset stomach or throwing up.  Loose stools (diarrhea).  Cough.  Prolia:  Back pain.  Muscle or joint pain.  Sore throat.  Runny nose.  Pain in arms or legs.  These are not all of the side effects that may occur. If you have questions about side effects, call your doctor. Call your doctor for medical advice about side effects.  You may report side effects  to your national health agency. How is this drug best taken?  Use this drug as ordered by your doctor. Read and follow the dosing on the label closely.  It is given as a shot into the fatty part of the skin. What do I do if I miss a dose?  Call the doctor to find out what to do. How do I store and/or throw out this drug?  This drug will be given to you in a hospital or doctor's office. You will not store it at home.  Keep all drugs out of the reach of children and pets.  Check with your pharmacist about how to throw out unused drugs.  General drug facts  If your symptoms or health problems do not get better or if they become worse, call your doctor.  Do not share your drugs with others and do not take anyone else's drugs.  Keep a list of all your drugs (prescription, natural products, vitamins, OTC) with you. Give this list to your doctor.  Talk with the doctor before starting any new drug, including prescription or OTC, natural products, or vitamins.  Some drugs may have another patient information leaflet. If you have any questions about this drug, please talk with your doctor, pharmacist, or other health care provider.  If you think there has been an overdose, call your poison control center or get medical care right away. Be ready to tell or show what was taken, how much, and when it happened.

## 2017-05-27 NOTE — Progress Notes (Signed)
Patient ID: Patricia Ware, female   DOB: 08-31-1965, 52 y.o.   MRN: [id number]  HPI  Patricia Ware is a 52 y.o.-year-old female, returning for follow-up for primary hyperparathyroidism and vitamin D deficiency. Last visit 1 year and 3 months ago.  Reviewed and addended history: Pt was dx with hypercalcemia in 2014. I reviewed pt's pertinent labs: Lab Results  Component Value Date   PTH 29 03/18/2016   PTH Comment 03/18/2016   PTH 132 (H) 11/01/2014   PTH 119 (H) 06/01/2014   CALCIUM 9.2 03/18/2016   CALCIUM 8.9 08/22/2015   CALCIUM 10.5 (H) 01/03/2015   CALCIUM 10.2 11/01/2014   CALCIUM 10.5 06/01/2014  10/26/2012: Ca 10.2 (8.4-10.5) 09/21/2012: Ca 10.4 (8.7-10.2)  No fractures and falls.  + h/o kidney stones. First in late 20's, repeated episodes in the last 8 years. She has one now. She saw Urology.  No stone was captured. She had further investigation by urology, and we'll try to get these records.  No h/o CKD. Last BUN/Cr: Lab Results  Component Value Date   BUN 15 08/22/2015   CREATININE 0.73 08/22/2015   Investigation for primary hyperparathyroidism was positive: Component     Latest Ref Rng 11/01/2014  Calcium     8.4 - 10.5 mg/dL 10.2  GFR     >60.00 mL/min 95.69  Vitamin D 1, 25 (OH) Total     18 - 72 pg/mL 113 (H)  Vitamin D3 1, 25 (OH)      113  Vitamin D2 1, 25 (OH)      <8  PTH     14 - 64 pg/mL 132 (H)  Phosphorus     2.3 - 4.6 mg/dL 2.3  Magnesium     1.5 - 2.5 mg/dL 1.9   Records from patient's urologist, Dr. Louis Meckel from 10/10/2014: 24 hour urine calcium was 407 mg a day, which is elevated. Her urine volume was 1.5 L. Calcium oxalate saturation, was also high, at 12.33, ideally <5. She also has an elevated saturation of calcium phosphate 2.27, ideally <1.2.  Patient was referred to surgery >> had right inferior parathyroidectomy on 01/19/2015 (Dr. Harlow Asa).  The pathology showed an adenoma of 1.1 g.   She felt much better after the  surgery y "like a weight has been lifted off on me".  A 24h Urine by Dr. Louis Meckel >> 301 mg a day. Also, Na was high (233 mmol/d) - she was advised to cut down sodium, as a high urinary sodium can cause hypercalciuria.  A new 24h collection by Dr. Louis Meckel: 196 mg a day, Na 104 (mmol/d).  Osteoporosis: - We reviewed together her bone density reports  -the scores from 2018 are better at the level of the spine, but worse in the hips.  03/24/2017: [facility]:  Lumbar spine (L1) L2-L4 Femoral neck (FN) 33% distal radius Ultradistal radius   T-score -3.0 RFN: -1.6 LFN: -1.3 -1.6 -1.1   03/06/2016: [facility]: L1-L4: T-score -3.3 LFN: -1.0 RFN: -1.4  Vitamin D deficiency: - Latest vitamin D was normal, but low in the normal range: Lab Results  Component Value Date   VD25OH 35.13 01/14/2016   VD25OH 27.98 (L) 10/30/2014   VD25OH 22.14 (L) 08/24/2014   She did not increase the vitamin D to 4000 IU daily at last visit. She is still on 53 00 IU daily.  ROS: Constitutional: + weight gain/no weight loss, no fatigue, no subjective hyperthermia, no subjective hypothermia Eyes: no blurry vision, no xerophthalmia  ENT: no sore throat, no nodules palpated in throat, no dysphagia, no odynophagia, no hoarseness Cardiovascular: no CP/no SOB/+ palpitations/+ leg swelling Respiratory: no cough/no SOB/no wheezing Gastrointestinal: no N/no V/no D/no C/no acid reflux Musculoskeletal: no muscle aches/no joint aches Skin: no rashes, no hair loss Neurological: no tremors/no numbness/no tingling/no dizziness  I reviewed pt's medications, allergies, PMH, social hx, family hx, and changes were documented in the history of present illness. Otherwise, unchanged from my initial visit note.  Since last visit, she stopped the Zoloft.  Past Medical History:  Diagnosis Date  . Adenomatous colon polyp 2013  . Anxiety   . Cold    with low grade fever, surgery cancelled 01-08-15 due to cold  .  Depression   . History of chicken pox   . Hot flashes   . Hyperparathyroidism ([place])    s/p R inf parathroidectomy in 2016; seeing Dr Cruzita Lederer, endocrinology  . Kidney stone    sees Dr. Louis Meckel, urology  . UTI (lower urinary tract infection)   . Vitamin D deficiency    Past Surgical History:  Procedure Laterality Date  . CESAREAN SECTION  2003  . COLONOSCOPY  10-04-14   per Dr. Olevia Perches, benign polyps, repeat in 10 yrs   . PARATHYROIDECTOMY Right 01/19/2015   Procedure: RIGHT INFERIOR PARATHYROIDECTOMY;  Surgeon: Armandina Gemma, MD;  Location: [facility];  Service: General;  Laterality: Right;  . POLYPECTOMY  11-26-2011   sessile serrated polyp  . TONSILLECTOMY  1992  . UMBILICAL HERNIA REPAIR  1977   History   Social History  . Marital Status: Married    Spouse Name: N/A  . children 1   Social History Main Topics  . Smoking status: Never Smoker   . Smokeless tobacco: Not on file  . Alcohol Use: Yes     Comment: 6 wine drinks per week  . Drug Use: No   Social History Narrative   Work or School: homemaker      Home Situation: lives with husband and 74 yo son in 2015      Spiritual Beliefs: Christian      Lifestyle: no regular exercise; diet is fair         Current Outpatient Medications on File Prior to Visit  Medication Sig Dispense Refill  . Cholecalciferol (VITAMIN D-3 PO) Take 4,000 Units by mouth daily.     . Cyanocobalamin (VITAMIN B-12) 1000 MCG SUBL Place under the tongue daily.    Marland Kitchen LORazepam (ATIVAN) 0.5 MG tablet Take 1 tablet 30 minutes prior to flight once 5 tablet 0  . sertraline (ZOLOFT) 50 MG tablet TAKE 1 TABLET (50 MG TOTAL) BY MOUTH DAILY. 90 tablet 1  . sertraline (ZOLOFT) 50 MG tablet Take 1 tablet (50 mg total) by mouth daily. 10 tablet 0   No current facility-administered medications on file prior to visit.    Allergies  Allergen Reactions  . Percocet [Oxycodone-Acetaminophen] Nausea Only    Makes pt feel nausea - prefers not to take   Family  History  Problem Relation Age of Onset  . Ulcerative colitis Father   . Deep vein thrombosis Father   . Pulmonary embolism Father   . Hypertension Father   . Colon polyps Father   . Hyperlipidemia Mother        and father  . Osteoarthritis Mother   . Gastric cancer Maternal Grandfather   . Stomach cancer Maternal Grandfather   . Colon cancer Neg Hx   . Rectal cancer Neg Hx   .  Diabetes Neg Hx    PE: BP 120/62 (BP Location: Left Arm, Patient Position: Sitting, Cuff Size: Normal)   Pulse 88   Temp 97.9 F (36.6 C) (Oral)   Ht 5' 7.75" (1.721 m)   Wt 184 lb 3.2 oz (83.6 kg)   LMP 06/05/2013   SpO2 96%   BMI 28.21 kg/m  There is no height or weight on file to calculate BMI. Wt Readings from Last 3 Encounters:  05/27/17 184 lb 3.2 oz (83.6 kg)  09/11/16 179 lb 1.6 oz (81.2 kg)  04/28/16 179 lb 9.6 oz (81.5 kg)   Constitutional: Slightly overweight, in NAD Eyes: PERRLA, EOMI, no exophthalmos ENT: moist mucous membranes, no thyromegaly, cervical scar healed, no cervical lymphadenopathy Cardiovascular: RRR, No MRG Respiratory: CTA B Gastrointestinal: abdomen soft, NT, ND, BS+ Musculoskeletal: no deformities, strength intact in all 4 Skin: moist, warm, no rashes Neurological: no tremor with outstretched hands, DTR normal in all 4  Assessment: 1. Hypercalcemia/hyperparathyroidism  2. Vitamin D deficiency  3. Osteoporosis  Plan: 1. Patient with history of primary hyperparathyroidism associated with hypercalciuria, now s/p right inferior parathyroidectomy by Dr. Harlow Asa in 12/2014.  Her calcium normalized after the surgery and she started to feel much better.  A subsequent 24-hour urine calcium checked by her urologist continued to show hypercalciuria, although improved from before.  Of note, her urine sodium in the same collection was 233 mmol/day, which is elevated and which can increase the urinary calcium.  She was advised to cut down sodium in her diet.  She did so, so at  the last visit with her urologist, her 24-hour urine calcium level and sodium were both normal. - We will recheck a PTH and calcium level today, but from now on, we only need to monitor her calcium, and not the PTH - I will see her back in a year.  2. Vitamin D deficiency  - Latest level was at the lower limit of normal - At last 2 visits I advised her to increase the supplement to 4000 units daily, but she continues on 2000 units daily - We will recheck the level today  3. Osteoporosis - We reviewed together her most recent DXA scan report: Scores are better at the level of the spine but worse at the hips. - At this visit, we discussed about possible benefits and side effects of different treatments.  We discussed about bisphosphonates, denosumab, but also teriparatide and abaloparatide. I suggested to start Prolia. She agrees.  Discussed about the low incidence of atypical fractures and osteonecrosis of the jaw during this treatment.  She does have dental work in progress and I advised her to finish this before we actually go ahead with the medication.  She will let me know when she is done with this. - We also discussed that we would repeat a bone density scan in 2 years after we start Prolia stable scores are slightly better are at the goal.   - Discussed about starting weightbearing exercises.  Given list of exercises recommended by the National osteoporosis foundation. - Discussed about the importance of a low acid diet - RTC in 1 year, but sooner for the Prolia injection  Orders Placed This Encounter  Procedures  . Parathyroid hormone, intact (no Ca)    Please send to [facility], not [facility]!  Marland Kitchen BASIC METABOLIC PANEL WITH GFR  . VITAMIN D 25 Hydroxy (Vit-D Deficiency, Fractures)   CC: Dr. Louis Meckel - [facility]  Component  Latest Ref Rng & Units 05/27/2017  Glucose     65 - 99 mg/dL 81  BUN     7 - 25 mg/dL 11  Creatinine     0.50 - 1.05 mg/dL 0.70  GFR, Est Non  African American     > OR = 60 mL/min/1.68m2 100  GFR, Est African American     > OR = 60 mL/min/1.47m2 115  BUN/Creatinine Ratio     6 - 22 (calc) NOT APPLICABLE  Sodium     179 - 146 mmol/L 141  Potassium     3.5 - 5.3 mmol/L 4.8  Chloride     98 - 110 mmol/L 105  CO2     20 - 32 mmol/L 30  Calcium     8.6 - 10.4 mg/dL 9.4  PTH, Intact     15 - 65 pg/mL 43  VITD     30.00 - 100.00 ng/mL 42.91  Labs all normal.  We will continue with her current dose of vitamin D.  Philemon Kingdom, MD PhD [facility] Endocr[facility]

## 2017-05-28 LAB — SPECIMEN STATUS REPORT

## 2017-05-29 LAB — PARATHYROID HORMONE, INTACT (NO CA): PTH: 43 pg/mL (ref 15–65)

## 2017-06-22 ENCOUNTER — Encounter: Payer: Self-pay | Admitting: Internal Medicine

## 2017-06-25 ENCOUNTER — Other Ambulatory Visit: Payer: Self-pay | Admitting: Internal Medicine

## 2017-06-25 MED ORDER — ALENDRONATE SODIUM 70 MG PO TABS: 70.0000 mg | ORAL_TABLET | ORAL | 11 refills | Status: AC

## 2017-08-27 ENCOUNTER — Other Ambulatory Visit: Payer: Self-pay

## 2017-08-27 ENCOUNTER — Telehealth: Payer: Self-pay

## 2017-08-27 MED ORDER — DENOSUMAB 60 MG/ML ~~LOC~~ SOSY: 60.0000 mg | PREFILLED_SYRINGE | SUBCUTANEOUS | 0 refills | Status: AC

## 2017-08-27 NOTE — Telephone Encounter (Signed)
Called patient and [facility] to schedule nurse visit for Prolia injection, and to let her know that I ordered her Prolia through [facility] outpatient pharmacy because her deductible on her insurance is so high that it would cost her less to get it this way- patient will make a nurse visit and bring Prolia with her to visit for [place] to give her- she is ready to be scheduled

## 2017-08-27 NOTE — Telephone Encounter (Signed)
Patient on Fosimax and not going to take Prolia at this time

## 2017-12-05 IMAGING — US US ABDOMEN LIMITED
1 series · 14 of 25 positions shown · non-contrast
Comparison: Abdominal and pelvic CT scan March 12, 2014.

CLINICAL DATA: Right upper quadrant pain for the past 6 months;
history of left lobe hepatic cysts.

EXAM:
US ABDOMEN LIMITED - RIGHT UPPER QUADRANT

[Series 1: us abdomen limited · 0.18mm/px · 14 of 44 slices shown]
[im 1/44]
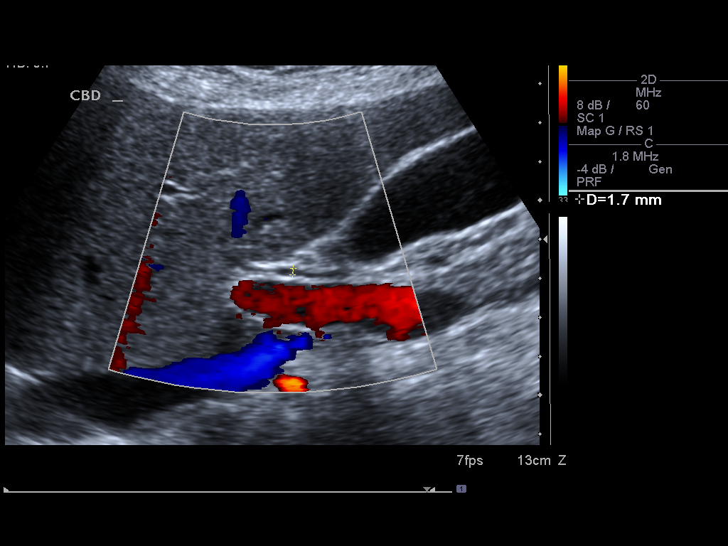
[im 4/44]
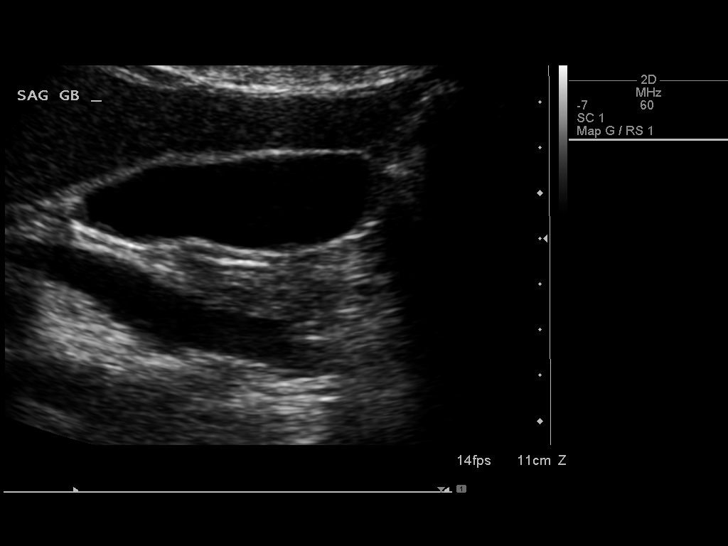
[im 8/44]
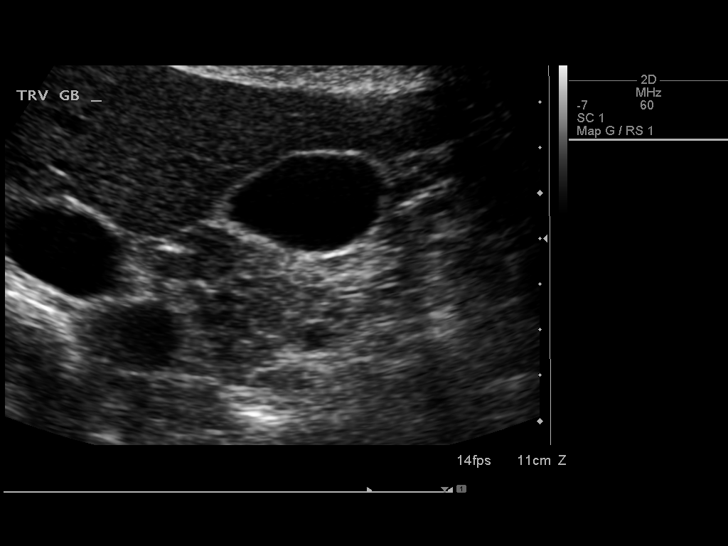
[im 11/44]
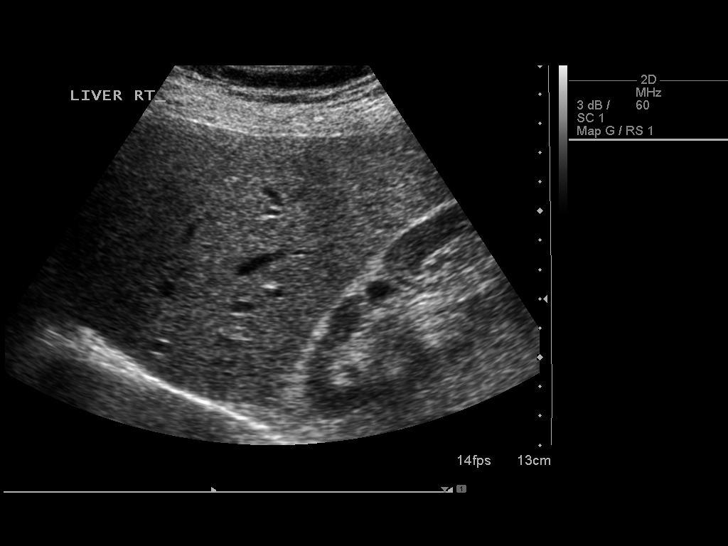
[im 15/44]
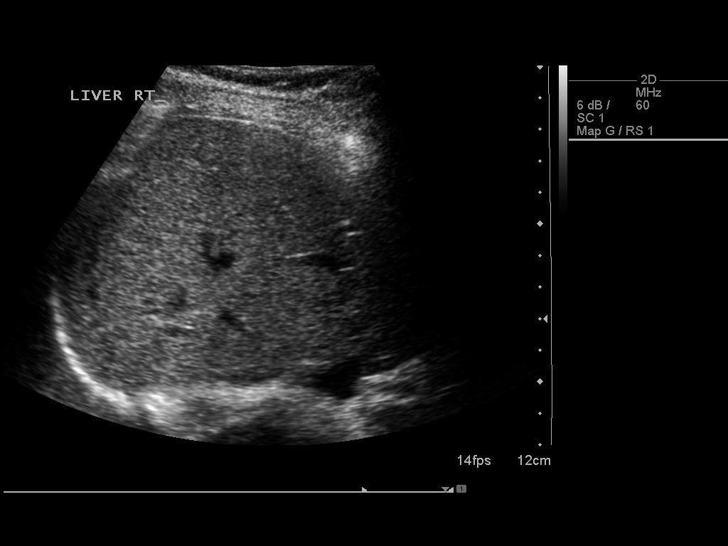
[im 17/44]
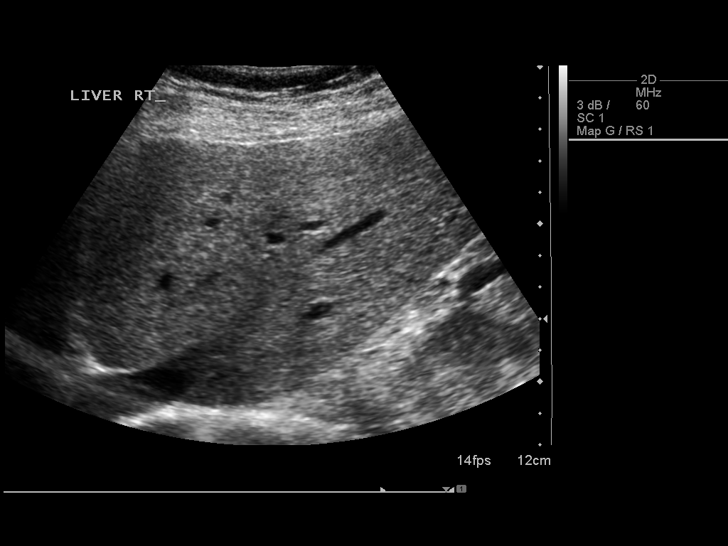
[im 20/44]
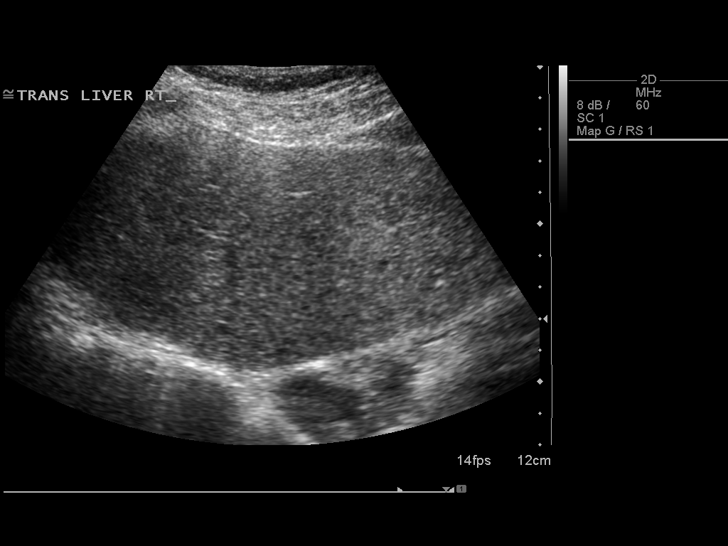
[im 24/44]
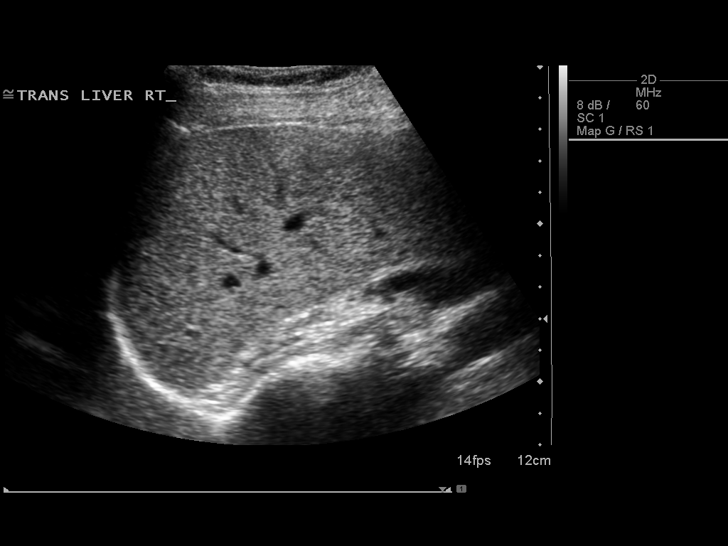
[im 27/44]
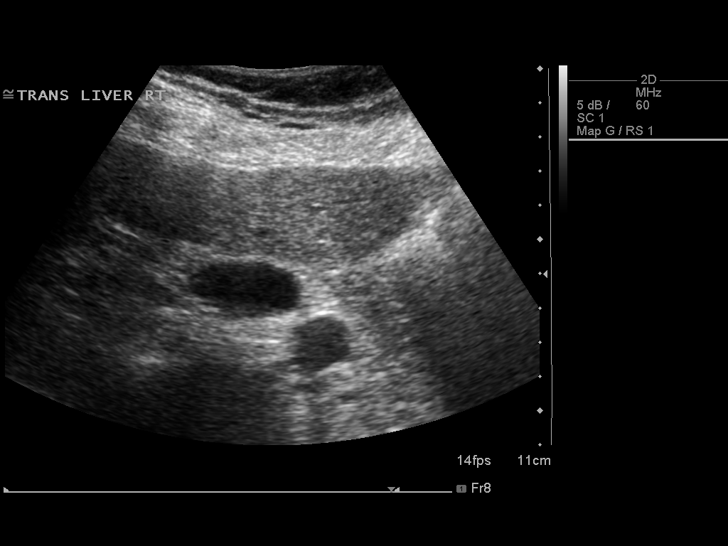
[im 29/44]
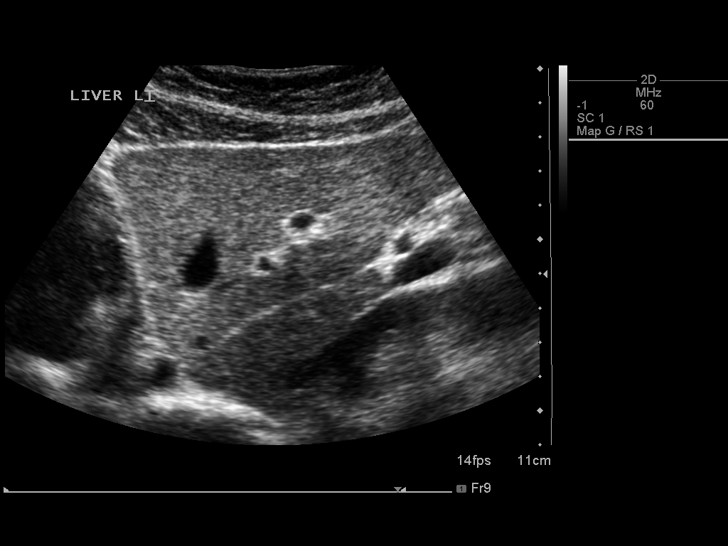
[im 33/44]
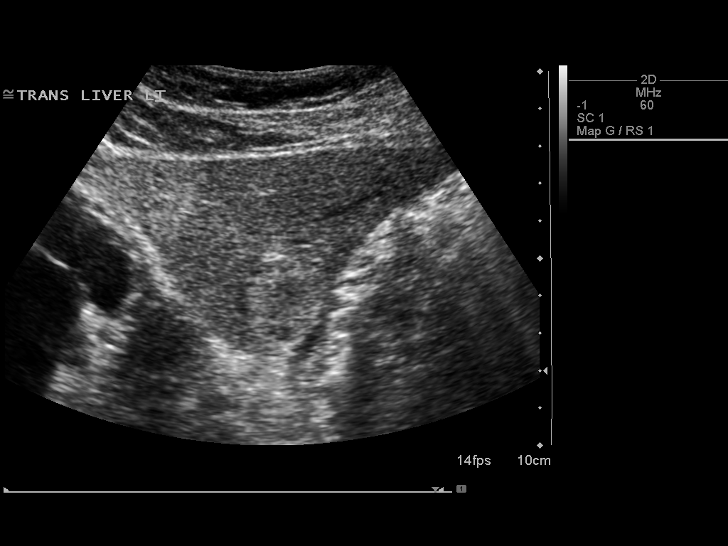
[im 36/44]
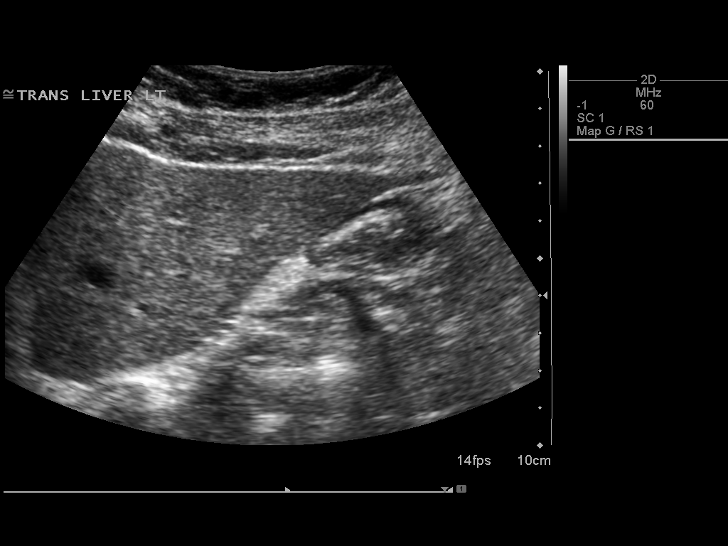
[im 40/44]
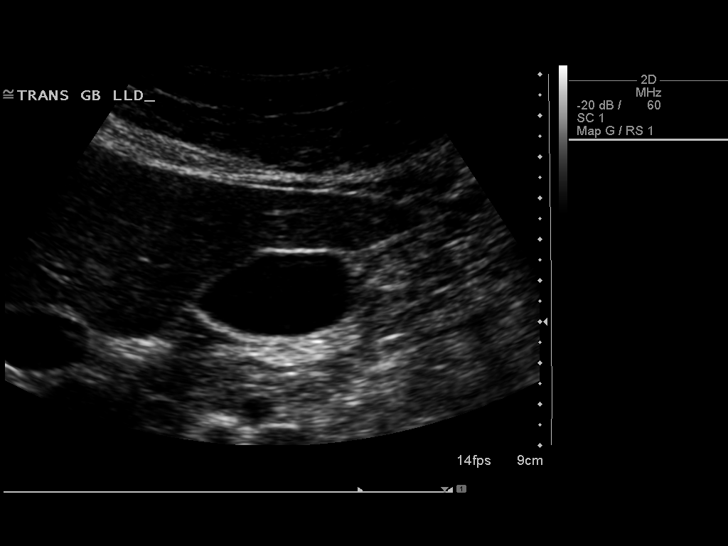
[im 44/44]
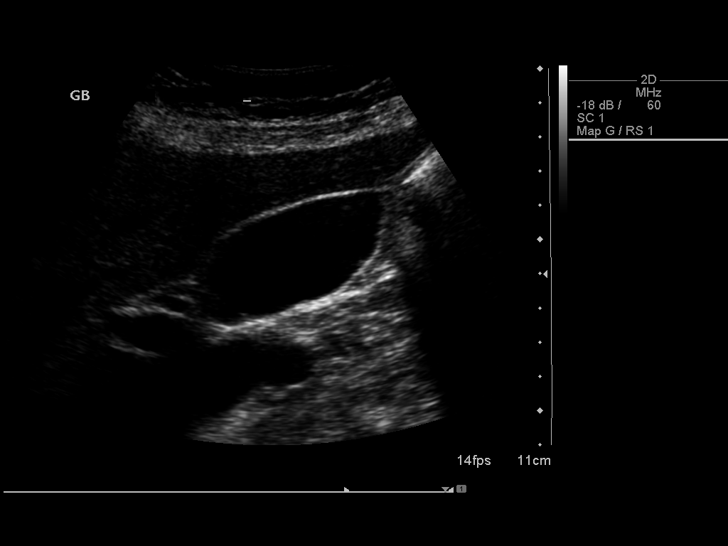

[14 of 25 positions shown; findings below may reference images not displayed]

FINDINGS: Gallbladder:

No gallstones or wall thickening visualized. No sonographic Murphy
sign noted by sonographer.

Common bile duct:

Diameter: 1.7 mm

Liver:

The hepatic echotexture is normal. In the left hepatic lobe there is
a hyperechoic focus with central decreased echogenicity which likely
reflects a hemangioma. This is in the site of the previous study. It
measures 2.5 x 2.6 x 2.3 cm and has increased in size. There is no
intrahepatic ductal dilation. The surface contour of the liver is
normal.
IMPRESSION: 1. Probable hemangioma within the left hepatic lobe which has
increased in size since the February 2014 abdominal CT scan. Hepatic
protocol MRI is recommended to further assess this lesion.
2. Normal appearance of the gallbladder and common bile duct.

## 2017-12-13 IMAGING — MR MR ABDOMEN WO/W CM
17 series · 48 of 48 positions shown · IV contrast (8ml eovist)
Comparison: Abdomen ultrasound on 08/29/2015 and noncontrast CT on
03/12/2014

CLINICAL DATA: Left hepatic lobe mass seen on recent ultrasound.
Right upper quadrant pain for several months.

EXAM:
MRI ABDOMEN WITHOUT AND WITH CONTRAST
TECHNIQUE: Multiplanar multisequence MR imaging of the abdomen was performed
both before and after the administration of intravenous contrast.
CONTRAST:  8 mL Eovist

[Series 3: T2 · coronal · 5.0mm · 1.56mm/px · 1 of 36 slices shown (1 of 3)]
[im 1/36]
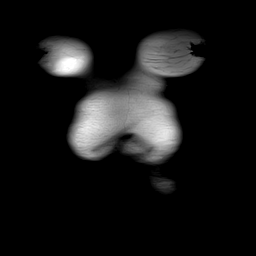

[Series 4: T1 · axial · 3.0mm · 1.19mm/px · z∈[-99,+114]mm · 5 of 144 slices shown]
[im 1/144]
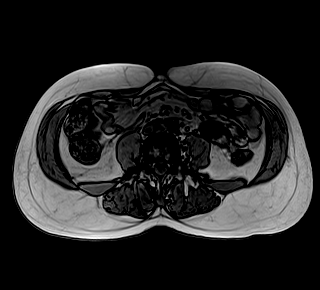
[im 36/144]
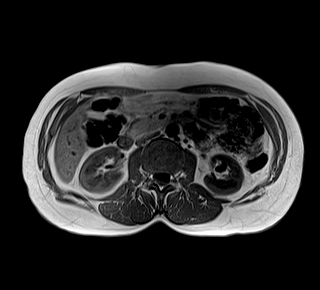
[im 72/144]
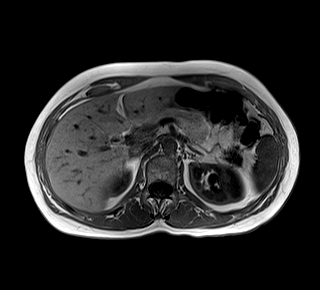
[im 108/144]
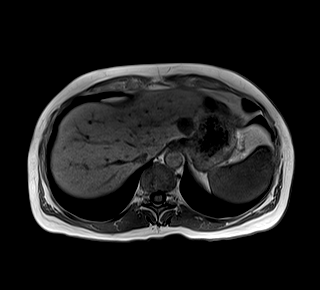
[im 144/144]
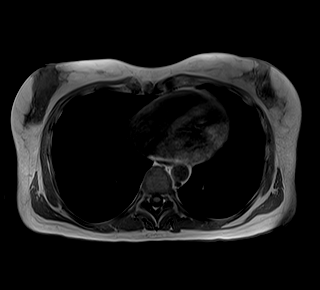

[Series 5: cor in and · coronal · 3.0mm · 1.19mm/px · 5 of 144 slices shown]
[im 1/144]
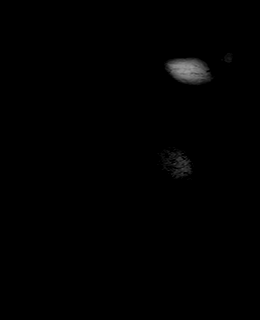
[im 36/144]
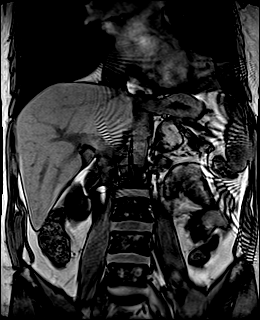
[im 72/144]
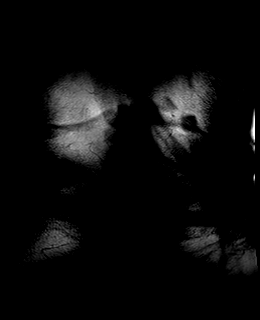
[im 108/144]
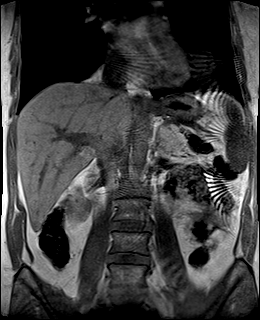
[im 144/144]
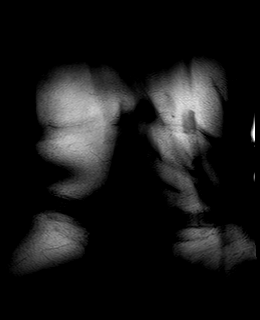

[Series 6: T1 dynamic · axial · non-contrast · 3.0mm · 1.19mm/px · z∈[-97,+116]mm · 3 of 72 slices shown]
[im 1/72]
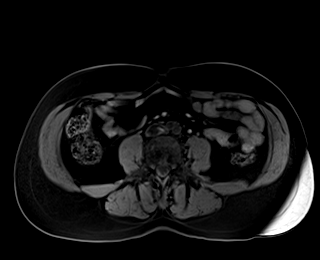
[im 36/72]
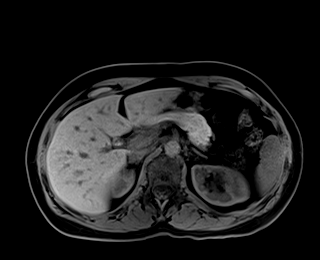
[im 72/72]
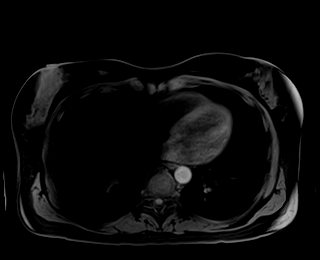

[Series 7: T1 dynamic post-contrast · axial · 3.0mm · 1.19mm/px · z∈[-97,+116]mm · 3 of 72 slices shown (1 of 9)]
[im 1/72]
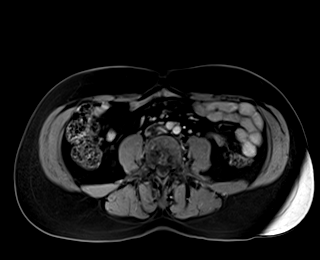
[im 36/72]
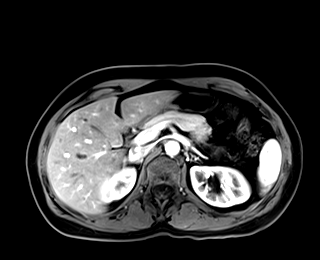
[im 72/72]
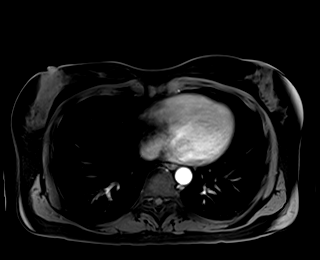

[Series 8: T1 dynamic post-contrast · axial · 3.0mm · 1.19mm/px · z∈[-97,+116]mm · 3 of 72 slices shown (2 of 9)]
[im 1/72]
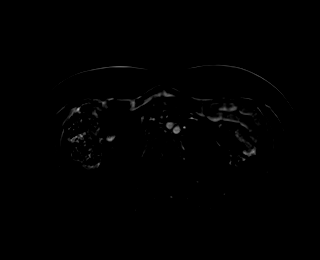
[im 36/72]
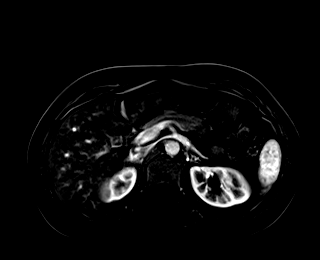
[im 72/72]
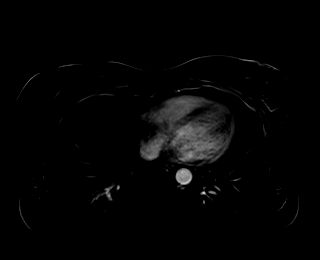

[Series 9: T1 dynamic post-contrast · axial · 3.0mm · 1.19mm/px · z∈[-97,+116]mm · 3 of 72 slices shown (3 of 9)]
[im 1/72]
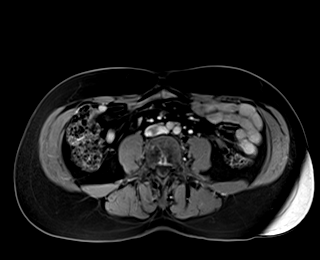
[im 36/72]
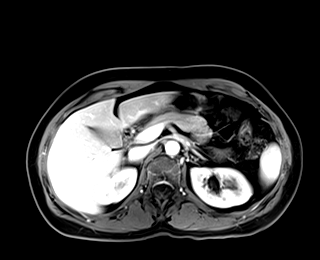
[im 72/72]
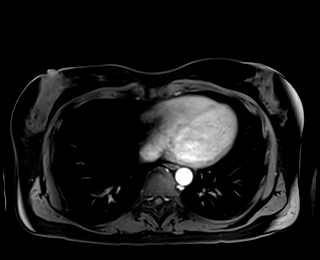

[Series 10: T1 dynamic post-contrast · axial · 3.0mm · 1.19mm/px · z∈[-97,+116]mm · 3 of 72 slices shown (4 of 9)]
[im 1/72]
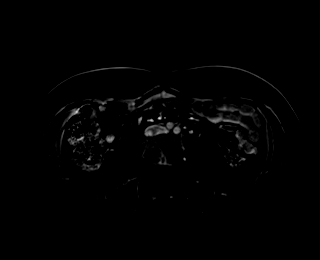
[im 36/72]
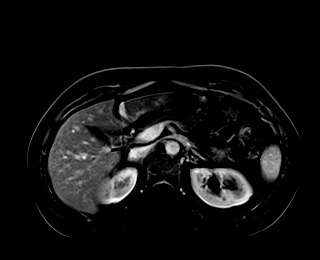
[im 72/72]
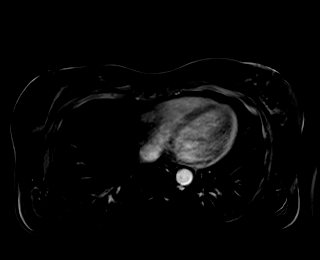

[Series 11: T1 dynamic post-contrast · axial · 3.0mm · 1.19mm/px · z∈[-97,+116]mm · 3 of 72 slices shown (5 of 9)]
[im 1/72]
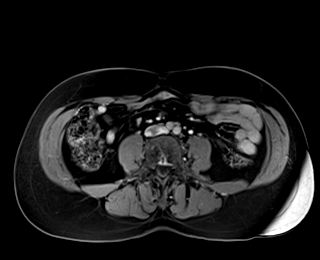
[im 36/72]
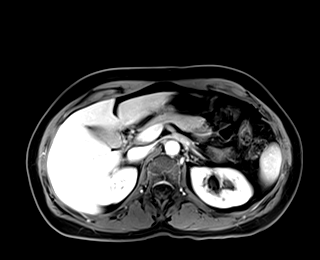
[im 72/72]
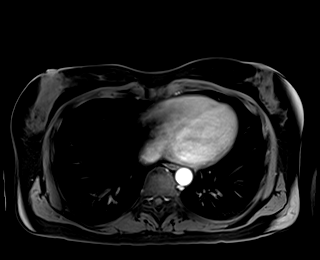

[Series 12: T1 dynamic post-contrast · axial · 3.0mm · 1.19mm/px · z∈[-97,+116]mm · 3 of 72 slices shown (6 of 9)]
[im 1/72]
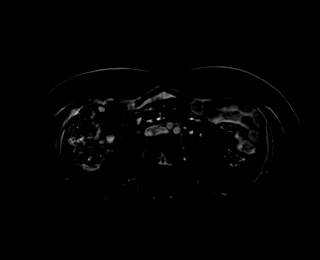
[im 36/72]
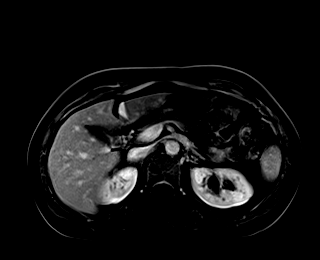
[im 72/72]
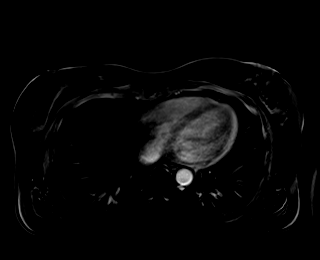

[Series 13: T1 dynamic post-contrast · coronal · 3.0mm · 1.25mm/px · 3 of 72 slices shown (7 of 9)]
[im 1/72]
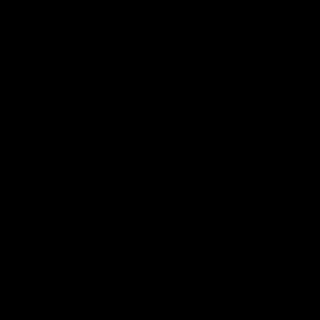
[im 36/72]
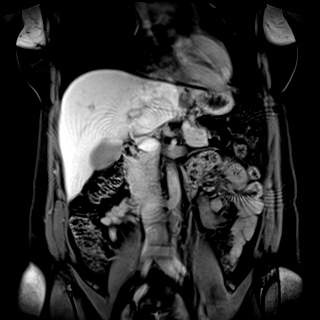
[im 72/72]
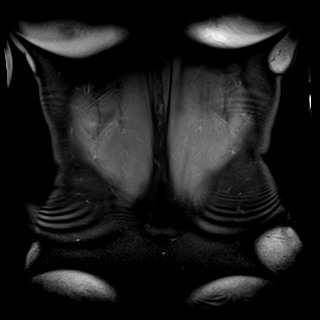

[Series 14: T2 · axial · 5.0mm · 1.48mm/px · 1 of 38 slices shown (2 of 3)]
[im 1/38]
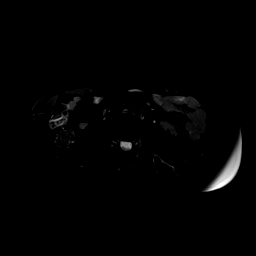

[Series 15: DWI · axial · 5.0mm · 1.42mm/px · z∈[-72,+138]mm · 4 of 108 slices shown (1 of 2)]
[im 1/108]
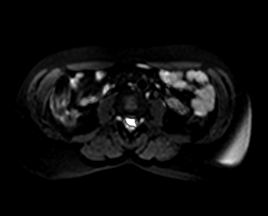
[im 36/108]
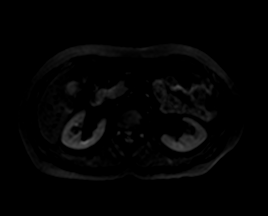
[im 72/108]
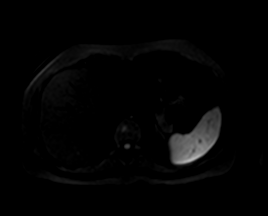
[im 108/108]
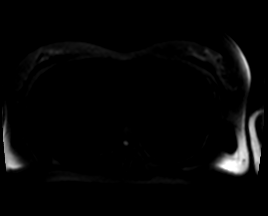

[Series 16: DWI · axial · 5.0mm · 1.42mm/px · 1 of 36 slices shown (2 of 2)]
[im 1/36]
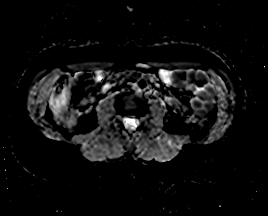

[Series 17: T2 · axial · 6.0mm · 1.22mm/px · 1 of 30 slices shown (3 of 3)]
[im 1/30]
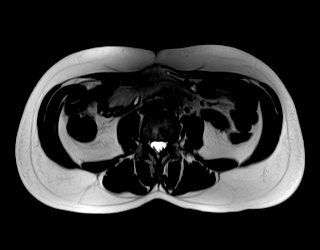

[Series 18: T1 dynamic post-contrast · axial · 3.0mm · 1.19mm/px · z∈[-97,+116]mm · 3 of 72 slices shown (8 of 9)]
[im 1/72]
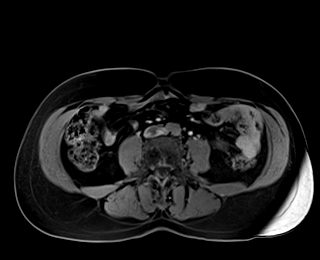
[im 36/72]
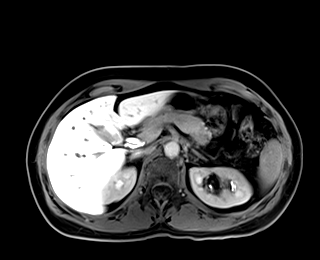
[im 72/72]
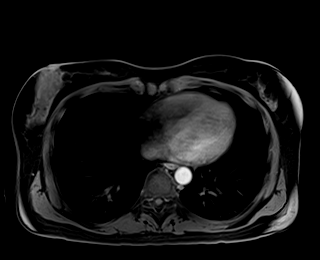

[Series 19: T1 dynamic post-contrast · axial · 3.0mm · 1.19mm/px · z∈[-97,+116]mm · 3 of 72 slices shown (9 of 9)]
[im 1/72]
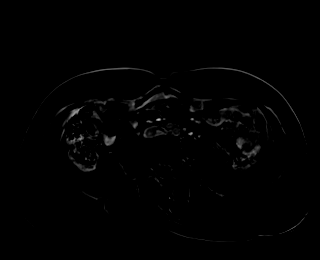
[im 36/72]
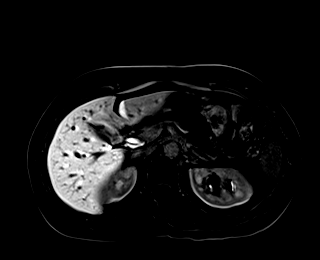
[im 72/72]
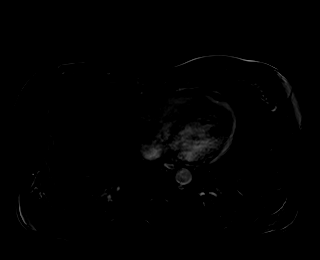

[48 of 48 positions shown; findings below may reference images not displayed]

FINDINGS: Lower chest:  No acute findings.

Hepatobiliary: A 2.2 x 2.1 cm lesion is seen in segment 2 of the
left lobe which shows near fluid T2 hyperintensity and mild
peripheral nodular contrast enhancement. This corresponds to lesions
seen on recent ultrasound and is also stable since previous CT in
9219, consistent with benign hemangioma. No other liver masses are
identified. Probable cyst tiny sub-cm cyst seen in the anterior
liver dome. Gallbladder is unremarkable. No evidence of biliary
ductal dilatation.

Pancreas: No mass, inflammatory changes, or other parenchymal
abnormality identified.

Spleen:  Within normal limits in size and appearance.

Adrenals/Urinary Tract: No masses identified. No evidence of
hydronephrosis. Tiny sub-cm left renal cysts noted.

Stomach/Bowel: Visualized portions within the abdomen are
unremarkable.

Vascular/Lymphatic: No pathologically enlarged lymph nodes
identified. No abdominal aortic aneurysm demonstrated.

Other:  None.

Musculoskeletal:  No suspicious bone lesions identified.
IMPRESSION: 2 cm benign hemangioma in the left hepatic lobe, corresponding to
lesion seen on recent ultrasound. No other significant abnormality
identified.
